# Patient Record
Sex: Female | Born: 1945 | Race: White | Hispanic: No | Smoking: Never smoker
Health system: Southern US, Community
[De-identification: ages and names within clinical notes are randomized; demographics above are authoritative.]

## PROBLEM LIST (undated history)

## (undated) DIAGNOSIS — I350 Nonrheumatic aortic (valve) stenosis: Secondary | ICD-10-CM

## (undated) DIAGNOSIS — E079 Disorder of thyroid, unspecified: Secondary | ICD-10-CM

## (undated) DIAGNOSIS — E7800 Pure hypercholesterolemia, unspecified: Secondary | ICD-10-CM

## (undated) DIAGNOSIS — Z972 Presence of dental prosthetic device (complete) (partial): Secondary | ICD-10-CM

## (undated) DIAGNOSIS — R011 Cardiac murmur, unspecified: Secondary | ICD-10-CM

## (undated) DIAGNOSIS — F419 Anxiety disorder, unspecified: Secondary | ICD-10-CM

## (undated) DIAGNOSIS — J302 Other seasonal allergic rhinitis: Secondary | ICD-10-CM

## (undated) DIAGNOSIS — M1990 Unspecified osteoarthritis, unspecified site: Secondary | ICD-10-CM

## (undated) DIAGNOSIS — Z973 Presence of spectacles and contact lenses: Secondary | ICD-10-CM

## (undated) HISTORY — DX: Cardiac murmur, unspecified: R01.1

## (undated) HISTORY — PX: ABDOMINAL HYSTERECTOMY: SHX81

## (undated) HISTORY — PX: COLONOSCOPY: SHX174

## (undated) HISTORY — DX: Nonrheumatic aortic (valve) stenosis: I35.0

---

## 1999-03-10 ENCOUNTER — Other Ambulatory Visit: Admission: RE | Admit: 1999-03-10 | Discharge: 1999-03-10 | Payer: Self-pay | Admitting: Obstetrics & Gynecology

## 2000-02-29 ENCOUNTER — Other Ambulatory Visit: Admission: RE | Admit: 2000-02-29 | Discharge: 2000-02-29 | Payer: Self-pay | Admitting: Obstetrics & Gynecology

## 2001-02-23 ENCOUNTER — Other Ambulatory Visit: Admission: RE | Admit: 2001-02-23 | Discharge: 2001-02-23 | Payer: Self-pay | Admitting: Obstetrics & Gynecology

## 2001-05-02 HISTORY — PX: SHOULDER ARTHROSCOPY: SHX128

## 2001-05-09 ENCOUNTER — Observation Stay (HOSPITAL_COMMUNITY): Admission: RE | Admit: 2001-05-09 | Discharge: 2001-05-10 | Payer: Self-pay | Admitting: Orthopedic Surgery

## 2002-02-11 ENCOUNTER — Other Ambulatory Visit: Admission: RE | Admit: 2002-02-11 | Discharge: 2002-02-11 | Payer: Self-pay | Admitting: Obstetrics & Gynecology

## 2003-02-17 ENCOUNTER — Other Ambulatory Visit: Admission: RE | Admit: 2003-02-17 | Discharge: 2003-02-17 | Payer: Self-pay | Admitting: Obstetrics & Gynecology

## 2004-02-19 ENCOUNTER — Other Ambulatory Visit: Admission: RE | Admit: 2004-02-19 | Discharge: 2004-02-19 | Payer: Self-pay | Admitting: Obstetrics & Gynecology

## 2005-02-23 ENCOUNTER — Other Ambulatory Visit: Admission: RE | Admit: 2005-02-23 | Discharge: 2005-02-23 | Payer: Self-pay | Admitting: Obstetrics & Gynecology

## 2007-04-11 ENCOUNTER — Encounter: Admission: RE | Admit: 2007-04-11 | Discharge: 2007-04-11 | Payer: Self-pay | Admitting: Obstetrics & Gynecology

## 2008-05-02 HISTORY — PX: SHOULDER ARTHROSCOPY: SHX128

## 2008-12-27 ENCOUNTER — Emergency Department (HOSPITAL_BASED_OUTPATIENT_CLINIC_OR_DEPARTMENT_OTHER): Admission: EM | Admit: 2008-12-27 | Discharge: 2008-12-27 | Payer: Self-pay | Admitting: Emergency Medicine

## 2008-12-27 ENCOUNTER — Ambulatory Visit: Payer: Self-pay | Admitting: Diagnostic Radiology

## 2009-05-11 ENCOUNTER — Encounter: Admission: RE | Admit: 2009-05-11 | Discharge: 2009-08-09 | Payer: Self-pay | Admitting: Orthopedic Surgery

## 2009-08-10 ENCOUNTER — Encounter: Admission: RE | Admit: 2009-08-10 | Discharge: 2009-11-08 | Payer: Self-pay | Admitting: Orthopedic Surgery

## 2009-11-09 ENCOUNTER — Encounter: Admission: RE | Admit: 2009-11-09 | Discharge: 2010-01-28 | Payer: Self-pay | Admitting: Orthopedic Surgery

## 2010-09-17 NOTE — Op Note (Signed)
[facility]  Patient:    Audrey Hatfield, Audrey Hatfield Visit Number: [id number] MRN: [id number]          Service Type: SUR Location: 4W [id number] [id number] Attending Physician:  Marlowe Kays Page Dictated by:   Illene Labrador. Aplington, M.D. Admit Date:  05/09/2001                             Operative Report  NO DICTATION. Dictated by:   Illene Labrador. Aplington, M.D. Attending Physician:  Joaquin Courts DD:  05/09/01 TD:  05/09/01 Job: [id number]

## 2011-01-19 IMAGING — CR DG SHOULDER 2+V*R*
3 series · 3 of 3 positions shown · non-contrast
Comparison: None available.

CLINICAL DATA: Shoulder pain.

RIGHT SHOULDER - 2+ VIEW

[w shoulder ap internal righ]
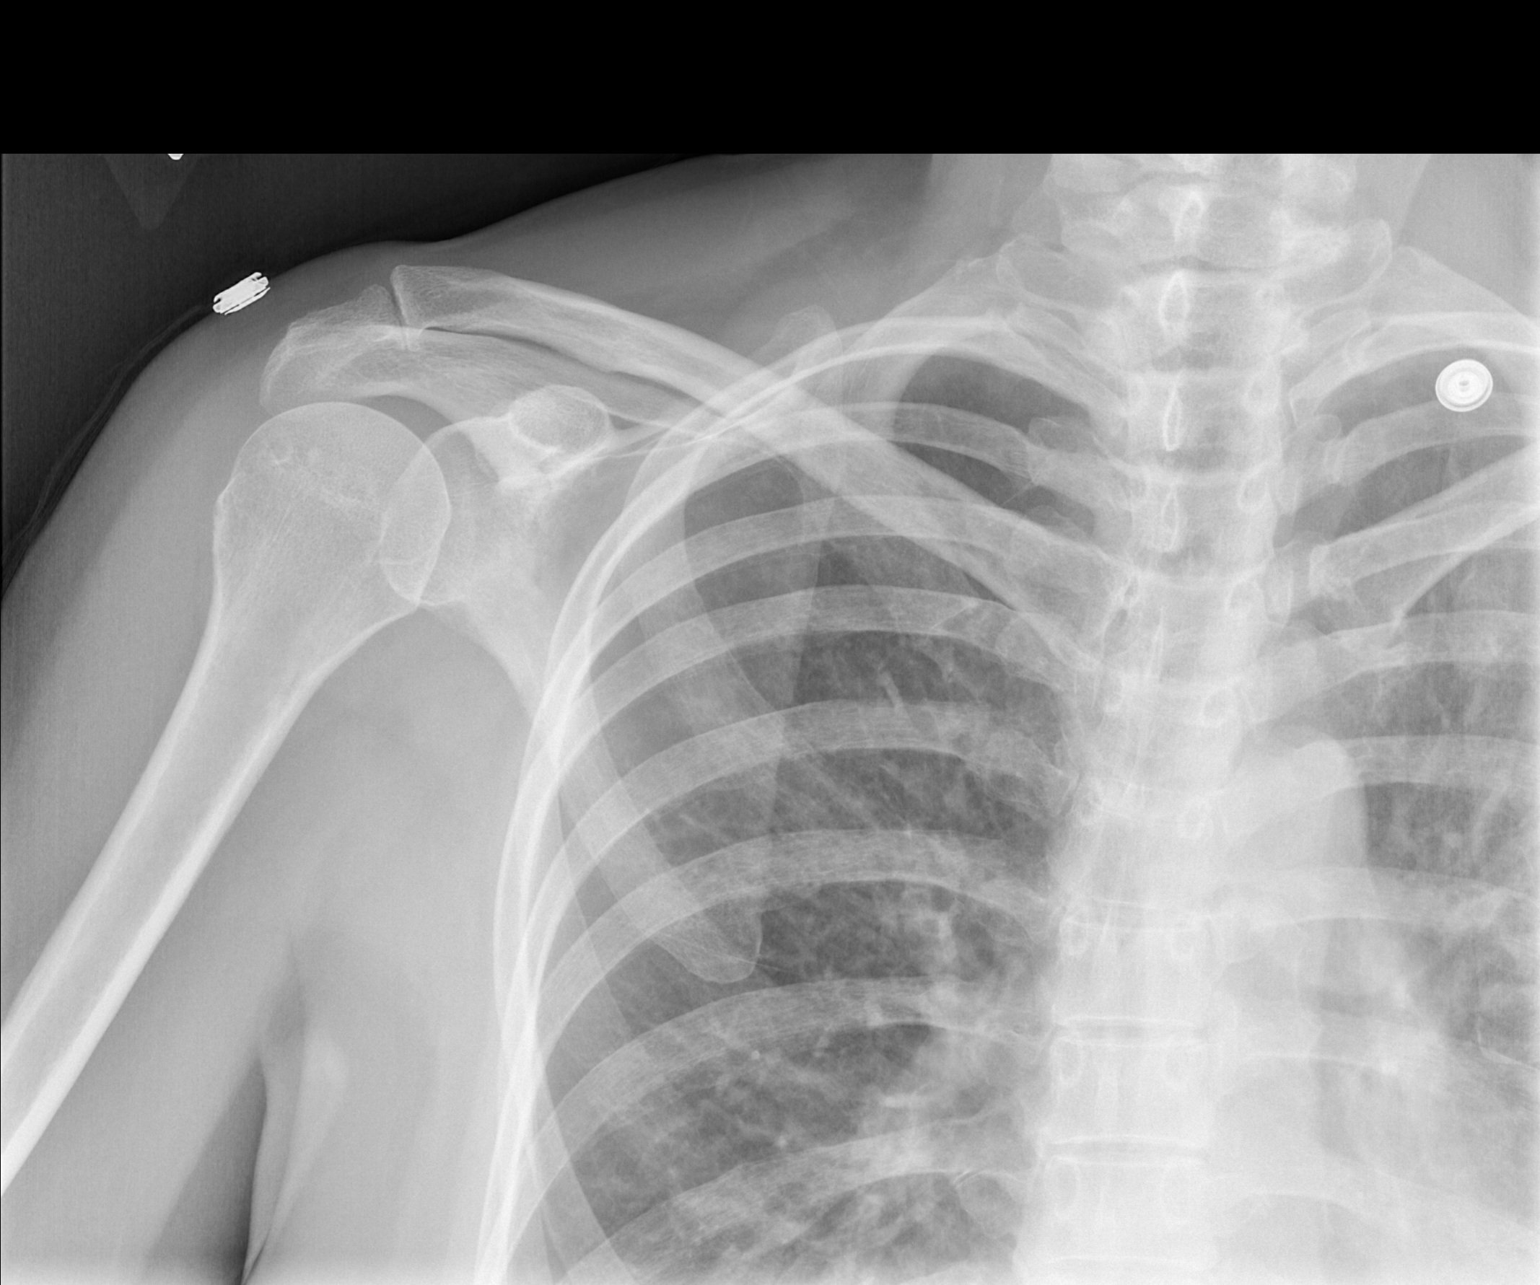

[w shoulder ap external righ]
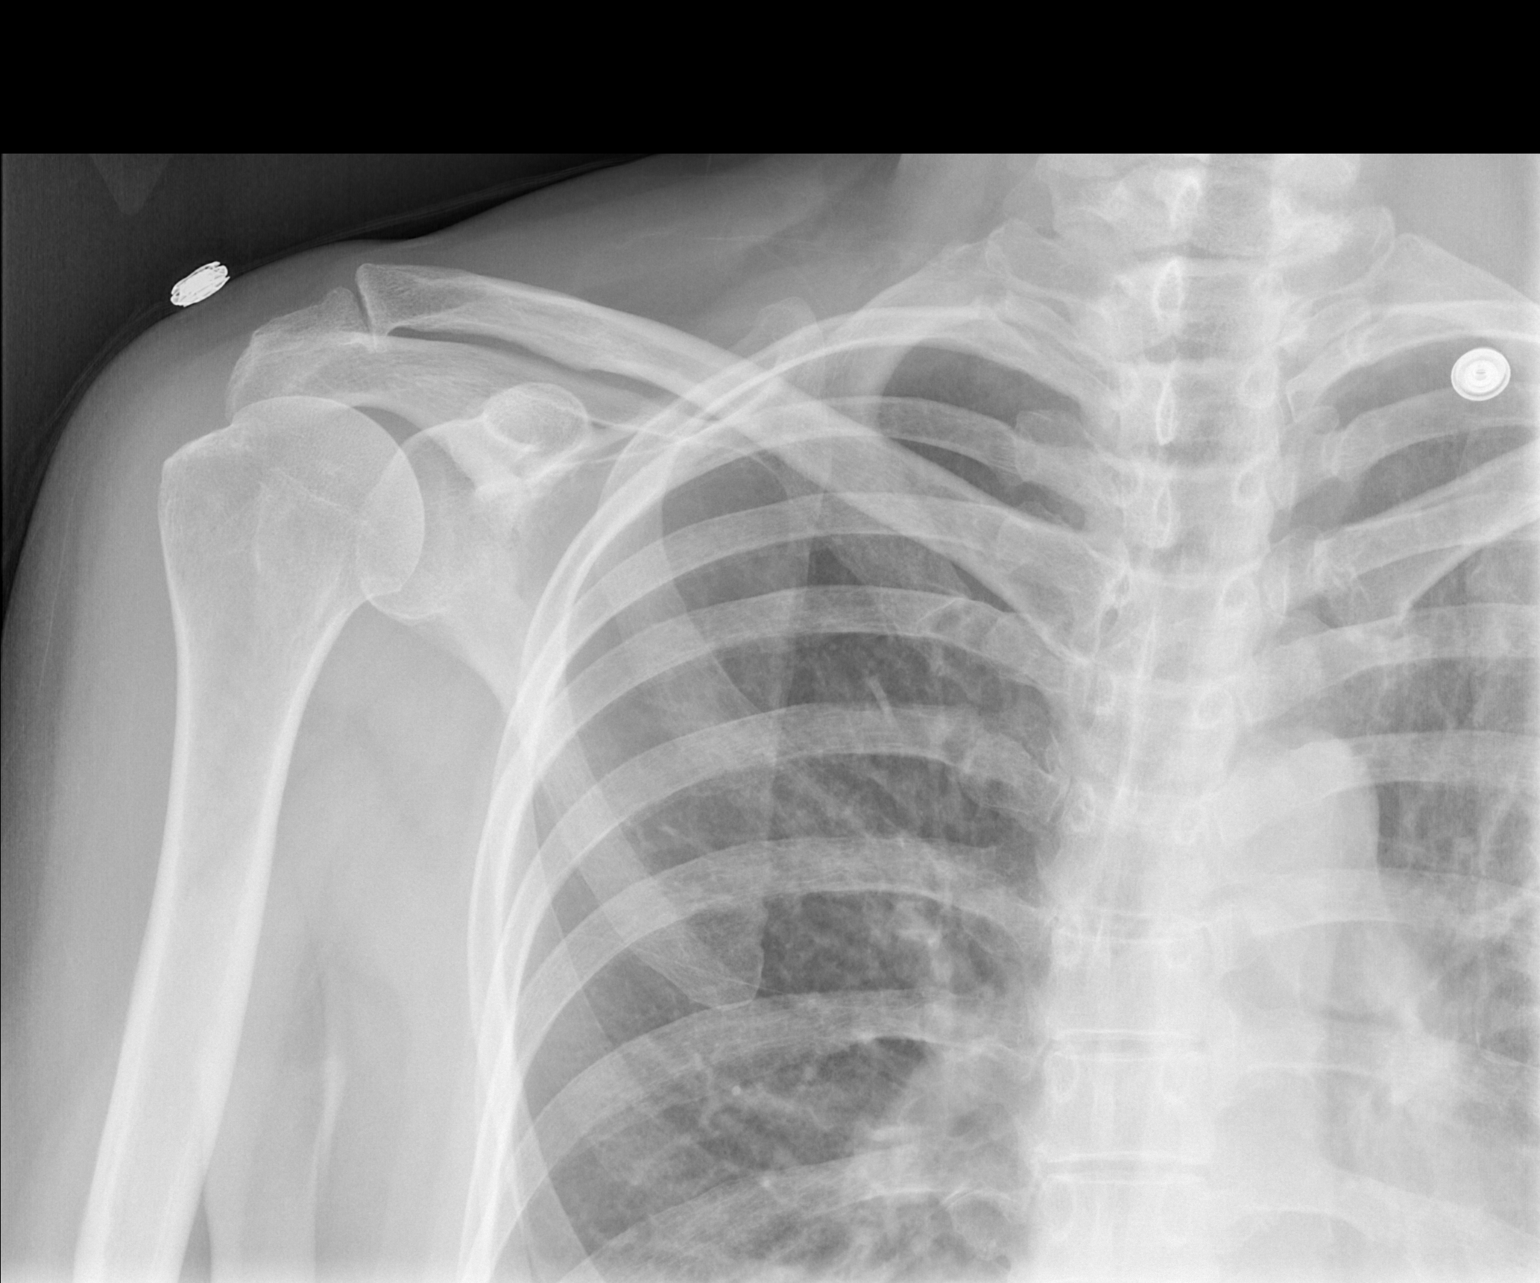

[w shoulder y view right]
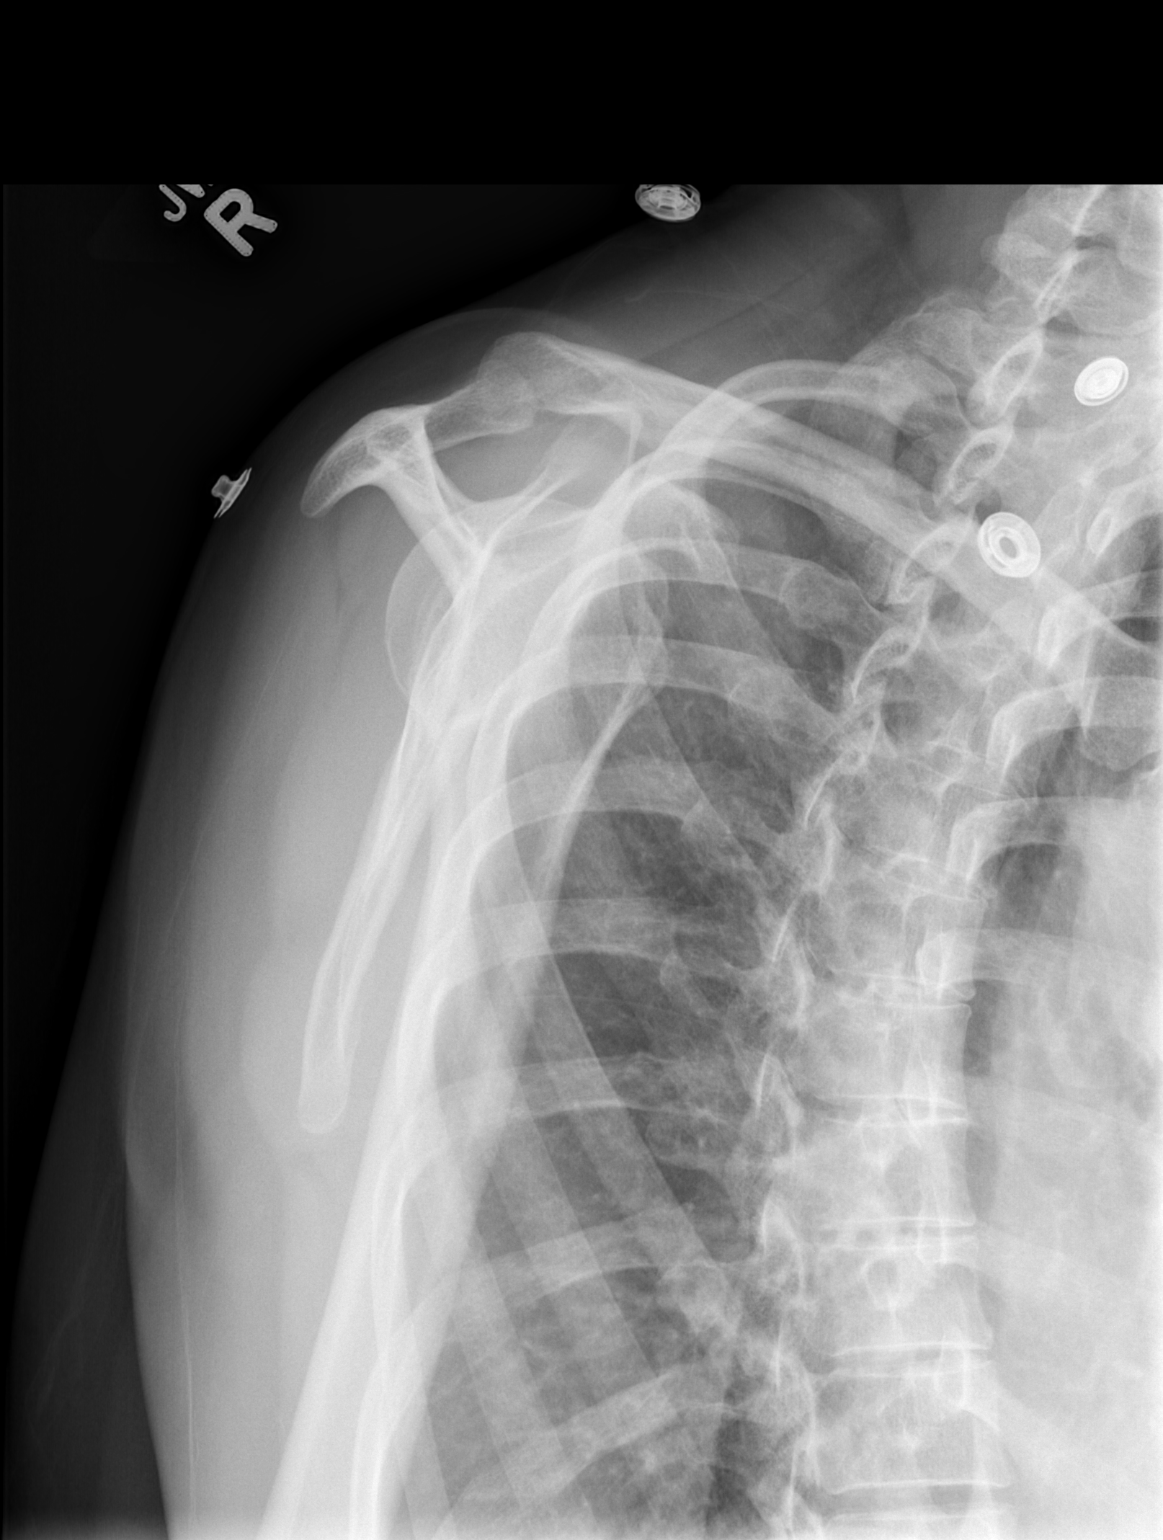

[3 of 3 positions shown; findings below may reference images not displayed]

FINDINGS: The humerus is located and the acromioclavicular joint is
intact.  There is no fracture.  Acromioclavicular degenerative
disease is noted.  Imaged lung parenchyma and ribs are
unremarkable.
IMPRESSION: 1.  No acute finding.
2.  Acromioclavicular osteoarthritis.

## 2014-04-13 ENCOUNTER — Emergency Department (HOSPITAL_BASED_OUTPATIENT_CLINIC_OR_DEPARTMENT_OTHER)
Admission: EM | Admit: 2014-04-13 | Discharge: 2014-04-13 | Disposition: A | Discharge status: Home / Self Care | Payer: Medicare Other | Attending: Emergency Medicine | Admitting: Emergency Medicine

## 2014-04-13 ENCOUNTER — Encounter (HOSPITAL_BASED_OUTPATIENT_CLINIC_OR_DEPARTMENT_OTHER): Payer: Self-pay

## 2014-04-13 ENCOUNTER — Emergency Department (HOSPITAL_BASED_OUTPATIENT_CLINIC_OR_DEPARTMENT_OTHER): Payer: Medicare Other

## 2014-04-13 DIAGNOSIS — S86112A Strain of other muscle(s) and tendon(s) of posterior muscle group at lower leg level, left leg, initial encounter: Secondary | ICD-10-CM | POA: Diagnosis not present

## 2014-04-13 DIAGNOSIS — Y998 Other external cause status: Secondary | ICD-10-CM | POA: Diagnosis not present

## 2014-04-13 DIAGNOSIS — Z79899 Other long term (current) drug therapy: Secondary | ICD-10-CM | POA: Insufficient documentation

## 2014-04-13 DIAGNOSIS — Y9289 Other specified places as the place of occurrence of the external cause: Secondary | ICD-10-CM | POA: Insufficient documentation

## 2014-04-13 DIAGNOSIS — S8992XA Unspecified injury of left lower leg, initial encounter: Secondary | ICD-10-CM | POA: Diagnosis present

## 2014-04-13 DIAGNOSIS — E780 Pure hypercholesterolemia: Secondary | ICD-10-CM | POA: Diagnosis not present

## 2014-04-13 DIAGNOSIS — Y9389 Activity, other specified: Secondary | ICD-10-CM | POA: Insufficient documentation

## 2014-04-13 DIAGNOSIS — T1490XA Injury, unspecified, initial encounter: Secondary | ICD-10-CM

## 2014-04-13 DIAGNOSIS — E079 Disorder of thyroid, unspecified: Secondary | ICD-10-CM | POA: Diagnosis not present

## 2014-04-13 DIAGNOSIS — X58XXXA Exposure to other specified factors, initial encounter: Secondary | ICD-10-CM | POA: Insufficient documentation

## 2014-04-13 DIAGNOSIS — S96812A Strain of other specified muscles and tendons at ankle and foot level, left foot, initial encounter: Secondary | ICD-10-CM

## 2014-04-13 HISTORY — DX: Disorder of thyroid, unspecified: E07.9

## 2014-04-13 HISTORY — DX: Pure hypercholesterolemia, unspecified: E78.00

## 2014-04-13 MED ORDER — HYDROCODONE-ACETAMINOPHEN 5-325 MG PO TABS: 2.0000 | ORAL_TABLET | ORAL | Status: DC | PRN

## 2014-04-13 NOTE — Discharge Instructions (Signed)
Your exam and history suggest that you have ruptured your plantaris tendon. Is a tiny muscle and tendon in the back of her leg that will not require repair. Symptoms with pain and swelling may last from one to 2 weeks. Avoid walking and use of your leg and elevate it whenever possible. Follow-up with your physician if symptoms aren't starting to improve within a week.  Tendon Injury Tendons are strong, cordlike structures that connect muscle to bone. Tendons are made up of woven fibers, like a rope. A tendon injury is a tear (rupture) of the tendon. The rupture may be partial (only a few of the fibers in your tendon rupture) or complete (your entire tendon ruptures). CAUSES  Tendon injuries can be caused by high-stress activities, such as sports. They also can be caused by a repetitive injury or by a single injury from an excessive, rapid force. SYMPTOMS  Symptoms of tendon injury include pain when you move the joint close to the tendon. Other symptoms are swelling, redness, and warmth. DIAGNOSIS  Tendon injuries often can be diagnosed by physical exam. However, sometimes an X-ray exam or advanced imaging, such as magnetic resonance imaging (MRI), is necessary to determine the extent of the injury. TREATMENT  Partial tendon ruptures often can be treated with immobilization. A splint, bandage, or removable brace usually is used to immobilize the injured tendon. Most injured tendons need to be immobilized for 1-2 months before they are completely healed. Complete tendon ruptures may require surgical reattachment. Document Released: 05/26/2004 Document Revised: 04/07/2011 Document Reviewed: 07/10/2011 [facility] Patient Information 2015 [place], [place]. This information is not intended to replace advice given to you by your health care provider. Make sure you discuss any questions you have with your health care provider.

## 2014-04-13 NOTE — ED Provider Notes (Signed)
CSN: [id number]     Arrival date & time 04/13/14  [id number] History   First MD Initiated Contact with Patient 04/13/14 [phone]     Chief Complaint  Patient presents with  . Knee Injury     HPI  She presents for evaluation of left calf pain. She states today's ago she was standing from a chair. When she went to take her first she felt a pop and pain in the back of her knee on the left side. It is been sore and painful with walking since then. Worse  this morning.  Past Medical History  Diagnosis Date  . High cholesterol   . Thyroid disease    History reviewed. No pertinent past surgical history. No family history on file. History  Substance Use Topics  . Smoking status: Never Smoker   . Smokeless tobacco: Not on file  . Alcohol Use: Not on file   OB History    No data available     Review of Systems  Constitutional: Negative for fever, chills, diaphoresis, appetite change and fatigue.  HENT: Negative for mouth sores, sore throat and trouble swallowing.   Eyes: Negative for visual disturbance.  Respiratory: Negative for cough, chest tightness, shortness of breath and wheezing.   Cardiovascular: Negative for chest pain.  Gastrointestinal: Negative for nausea, vomiting, abdominal pain, diarrhea and abdominal distention.  Endocrine: Negative for polydipsia, polyphagia and polyuria.  Genitourinary: Negative for dysuria, frequency and hematuria.  Musculoskeletal: Negative for gait problem.       Left calf pain  Skin: Negative for color change, pallor and rash.  Neurological: Negative for dizziness, syncope, light-headedness and headaches.  Hematological: Does not bruise/bleed easily.  Psychiatric/Behavioral: Negative for behavioral problems and confusion.      Allergies  Review of patient's allergies indicates no known allergies.  Home Medications   Prior to Admission medications   Medication Sig Start Date End Date Taking? Authorizing Provider  ALPRAZolam Prudy Feeler(XANAX) 0.5 MG  tablet Take 0.5 mg by mouth at bedtime as needed for anxiety.   Yes Historical Provider, MD  atorvastatin (LIPITOR) 20 MG tablet Take 20 mg by mouth daily.   Yes Historical Provider, MD  levothyroxine (SYNTHROID, LEVOTHROID) 25 MCG tablet Take 25 mcg by mouth daily before breakfast.   Yes Historical Provider, MD  HYDROcodone-acetaminophen (NORCO/VICODIN) 5-325 MG per tablet Take 2 tablets by mouth every 4 (four) hours as needed. 04/13/14   Rolland PorterMark Zecca, MD   BP 164/88 mmHg  Pulse 70  Temp(Src) 97.1 F (36.2 C)  Resp 20  Ht 5\' 3"  (1.6 m)  Wt 126 lb (57.153 kg)  BMI 22.33 kg/m2  SpO2 100% Physical Exam  Constitutional: She is oriented to person, place, and time. She appears well-developed and well-nourished. No distress.  HENT:  Head: Normocephalic.  Eyes: Conjunctivae are normal. Pupils are equal, round, and reactive to light. No scleral icterus.  Neck: Normal range of motion. Neck supple. No thyromegaly present.  Cardiovascular: Normal rate and regular rhythm.  Exam reveals no gallop and no friction rub.   No murmur heard. Pulmonary/Chest: Effort normal and breath sounds normal. No respiratory distress. She has no wheezes. She has no rales.  Abdominal: Soft. Bowel sounds are normal. She exhibits no distension. There is no tenderness. There is no rebound.  Musculoskeletal: Normal range of motion.       Legs: Neurological: She is alert and oriented to person, place, and time.  Skin: Skin is warm and dry. No rash noted.  Psychiatric: She  has a normal mood and affect. Her behavior is normal.    ED Course  Procedures (including critical care time) Labs Review Labs Reviewed - No data to display  Imaging Review No results found.   EKG Interpretation None      MDM   Final diagnoses:  Rupture of plantaris tendon, left, initial encounter    History and exam are all consistent with probable plantaris tendon rupture. Her gastrocnemius mechanism and Achilles tendon are intact. No  swelling or asymmetry to suggest DVT sudden in onset. Plan will be avoiding activity, elevation, pain control. Primary care follow-up if not improving within the week.    Rolland PorterMark Rio, MD 04/13/14 [phone]

## 2014-04-13 NOTE — ED Notes (Signed)
Patient here with left knee injury on Friday night. Reports that she stood from chair and felt something pop in back of knee, arrived with brace to same. Reports unable to ambulate or rest due to discomfort

## 2014-08-05 ENCOUNTER — Encounter (HOSPITAL_BASED_OUTPATIENT_CLINIC_OR_DEPARTMENT_OTHER): Payer: Self-pay | Admitting: *Deleted

## 2014-08-05 NOTE — Progress Notes (Signed)
No labs needed

## 2014-08-08 ENCOUNTER — Ambulatory Visit (HOSPITAL_BASED_OUTPATIENT_CLINIC_OR_DEPARTMENT_OTHER): Payer: Medicare Other | Admitting: Certified Registered"

## 2014-08-08 ENCOUNTER — Encounter (HOSPITAL_BASED_OUTPATIENT_CLINIC_OR_DEPARTMENT_OTHER): Payer: Self-pay | Admitting: *Deleted

## 2014-08-08 ENCOUNTER — Ambulatory Visit (HOSPITAL_BASED_OUTPATIENT_CLINIC_OR_DEPARTMENT_OTHER)
Admission: RE | Admit: 2014-08-08 | Discharge: 2014-08-08 | Disposition: A | Discharge status: Home / Self Care | Payer: Medicare Other | Source: Ambulatory Visit | Attending: Orthopedic Surgery | Admitting: Orthopedic Surgery

## 2014-08-08 ENCOUNTER — Encounter (HOSPITAL_BASED_OUTPATIENT_CLINIC_OR_DEPARTMENT_OTHER)
Admission: RE | Disposition: A | Discharge status: Home / Self Care | Payer: Self-pay | Source: Ambulatory Visit | Attending: Orthopedic Surgery

## 2014-08-08 DIAGNOSIS — E780 Pure hypercholesterolemia: Secondary | ICD-10-CM | POA: Diagnosis not present

## 2014-08-08 DIAGNOSIS — M1990 Unspecified osteoarthritis, unspecified site: Secondary | ICD-10-CM | POA: Diagnosis not present

## 2014-08-08 DIAGNOSIS — E079 Disorder of thyroid, unspecified: Secondary | ICD-10-CM | POA: Diagnosis not present

## 2014-08-08 DIAGNOSIS — J302 Other seasonal allergic rhinitis: Secondary | ICD-10-CM | POA: Insufficient documentation

## 2014-08-08 DIAGNOSIS — G5602 Carpal tunnel syndrome, left upper limb: Secondary | ICD-10-CM | POA: Insufficient documentation

## 2014-08-08 DIAGNOSIS — F419 Anxiety disorder, unspecified: Secondary | ICD-10-CM | POA: Insufficient documentation

## 2014-08-08 DIAGNOSIS — Z79899 Other long term (current) drug therapy: Secondary | ICD-10-CM | POA: Insufficient documentation

## 2014-08-08 HISTORY — DX: Unspecified osteoarthritis, unspecified site: M19.90

## 2014-08-08 HISTORY — DX: Presence of dental prosthetic device (complete) (partial): Z97.2

## 2014-08-08 HISTORY — PX: CARPAL TUNNEL RELEASE: SHX101

## 2014-08-08 HISTORY — DX: Anxiety disorder, unspecified: F41.9

## 2014-08-08 HISTORY — DX: Other seasonal allergic rhinitis: J30.2

## 2014-08-08 HISTORY — DX: Presence of spectacles and contact lenses: Z97.3

## 2014-08-08 LAB — POCT HEMOGLOBIN-HEMACUE: Hemoglobin: 14.7 g/dL (ref 12.0–15.0)

## 2014-08-08 SURGERY — CARPAL TUNNEL RELEASE
Anesthesia: Monitor Anesthesia Care | Site: Wrist | Laterality: Left

## 2014-08-08 MED ORDER — OXYCODONE HCL 5 MG/5ML PO SOLN: 5.0000 mg | Freq: Once | ORAL | Status: DC | PRN

## 2014-08-08 MED ORDER — MIDAZOLAM HCL 2 MG/2ML IJ SOLN: 1.0000 mg | INTRAMUSCULAR | Status: DC | PRN

## 2014-08-08 MED ORDER — OXYCODONE HCL 5 MG PO TABS: 5.0000 mg | ORAL_TABLET | Freq: Once | ORAL | Status: DC | PRN

## 2014-08-08 MED ORDER — PROMETHAZINE HCL 25 MG/ML IJ SOLN: 6.2500 mg | INTRAMUSCULAR | Status: DC | PRN

## 2014-08-08 MED ORDER — ONDANSETRON HCL 4 MG/2ML IJ SOLN
INTRAMUSCULAR | Status: DC | PRN
  Administered 2014-08-08: 4 mg via INTRAVENOUS

## 2014-08-08 MED ORDER — LACTATED RINGERS IV SOLN
INTRAVENOUS | Status: DC
  Administered 2014-08-08: 09:00:00 via INTRAVENOUS

## 2014-08-08 MED ORDER — SODIUM BICARBONATE 4 % IV SOLN
INTRAVENOUS | Status: DC | PRN
  Administered 2014-08-08: 2 mL via SUBCUTANEOUS

## 2014-08-08 MED ORDER — CEFAZOLIN SODIUM-DEXTROSE 2-3 GM-% IV SOLR
INTRAVENOUS | Status: DC | PRN
  Administered 2014-08-08: 2 g via INTRAVENOUS

## 2014-08-08 MED ORDER — LIDOCAINE HCL (PF) 1 % IJ SOLN
INTRAMUSCULAR | Status: DC | PRN
  Administered 2014-08-08: 10 mL

## 2014-08-08 MED ORDER — CEFAZOLIN SODIUM-DEXTROSE 2-3 GM-% IV SOLR
INTRAVENOUS | Status: AC
  Filled 2014-08-08: qty 50

## 2014-08-08 MED ORDER — FENTANYL CITRATE 0.05 MG/ML IJ SOLN
INTRAMUSCULAR | Status: DC | PRN
  Administered 2014-08-08: 50 ug via INTRAVENOUS

## 2014-08-08 MED ORDER — HYDROMORPHONE HCL 1 MG/ML IJ SOLN: 0.2500 mg | INTRAMUSCULAR | Status: DC | PRN

## 2014-08-08 MED ORDER — HYDROCODONE-ACETAMINOPHEN 5-325 MG PO TABS: 2.0000 | ORAL_TABLET | Freq: Four times a day (QID) | ORAL | Status: DC | PRN

## 2014-08-08 MED ORDER — MIDAZOLAM HCL 5 MG/5ML IJ SOLN
INTRAMUSCULAR | Status: DC | PRN
  Administered 2014-08-08: 1 mg via INTRAVENOUS

## 2014-08-08 MED ORDER — FENTANYL CITRATE 0.05 MG/ML IJ SOLN: 50.0000 ug | INTRAMUSCULAR | Status: DC | PRN

## 2014-08-08 MED ORDER — BUPIVACAINE HCL (PF) 0.25 % IJ SOLN
INTRAMUSCULAR | Status: DC | PRN
  Administered 2014-08-08: 10 mL

## 2014-08-08 SURGICAL SUPPLY — 50 items
BANDAGE ELASTIC 3 VELCRO ST LF (GAUZE/BANDAGES/DRESSINGS) ×3 IMPLANT
BLADE CARPAL TUNNEL SNGL USE (BLADE) ×3 IMPLANT
BLADE SURG 15 STRL LF DISP TIS (BLADE) ×2 IMPLANT
BLADE SURG 15 STRL SS (BLADE) ×6
BNDG CONFORM 3 STRL LF (GAUZE/BANDAGES/DRESSINGS) ×3 IMPLANT
BRUSH SCRUB EZ PLAIN DRY (MISCELLANEOUS) ×3 IMPLANT
CORDS BIPOLAR (ELECTRODE) ×3 IMPLANT
COVER BACK TABLE 60X90IN (DRAPES) ×3 IMPLANT
COVER MAYO STAND STRL (DRAPES) ×3 IMPLANT
CUFF TOURNIQUET SINGLE 18IN (TOURNIQUET CUFF) IMPLANT
DRAIN PENROSE 1/4X12 LTX STRL (WOUND CARE) IMPLANT
DRAPE EXTREMITY T 121X128X90 (DRAPE) ×3 IMPLANT
DRAPE SURG 17X23 STRL (DRAPES) ×3 IMPLANT
DRSG EMULSION OIL 3X3 NADH (GAUZE/BANDAGES/DRESSINGS) ×3 IMPLANT
GAUZE SPONGE 4X4 12PLY STRL (GAUZE/BANDAGES/DRESSINGS) IMPLANT
GAUZE SPONGE 4X4 16PLY XRAY LF (GAUZE/BANDAGES/DRESSINGS) IMPLANT
GAUZE XEROFORM 1X8 LF (GAUZE/BANDAGES/DRESSINGS) ×3 IMPLANT
GLOVE BIO SURGEON STRL SZ 6.5 (GLOVE) ×1 IMPLANT
GLOVE BIO SURGEONS STRL SZ 6.5 (GLOVE) ×1
GLOVE BIOGEL M STRL SZ7.5 (GLOVE) ×3 IMPLANT
GLOVE BIOGEL PI IND STRL 7.0 (GLOVE) IMPLANT
GLOVE BIOGEL PI INDICATOR 7.0 (GLOVE) ×6
GLOVE ECLIPSE 7.0 STRL STRAW (GLOVE) ×2 IMPLANT
GLOVE SS BIOGEL STRL SZ 8 (GLOVE) ×1 IMPLANT
GLOVE SUPERSENSE BIOGEL SZ 8 (GLOVE) ×2
GOWN STRL REUS W/ TWL LRG LVL3 (GOWN DISPOSABLE) ×1 IMPLANT
GOWN STRL REUS W/ TWL XL LVL3 (GOWN DISPOSABLE) ×1 IMPLANT
GOWN STRL REUS W/TWL LRG LVL3 (GOWN DISPOSABLE) ×3
GOWN STRL REUS W/TWL XL LVL3 (GOWN DISPOSABLE) ×3
LOOP VESSEL MAXI BLUE (MISCELLANEOUS) IMPLANT
NDL HYPO 25X1 1.5 SAFETY (NEEDLE) ×2 IMPLANT
NDL SAFETY ECLIPSE 18X1.5 (NEEDLE) ×1 IMPLANT
NEEDLE HYPO 18GX1.5 SHARP (NEEDLE) ×3
NEEDLE HYPO 22GX1.5 SAFETY (NEEDLE) IMPLANT
NEEDLE HYPO 25X1 1.5 SAFETY (NEEDLE) ×6 IMPLANT
NS IRRIG 1000ML POUR BTL (IV SOLUTION) ×3 IMPLANT
PACK BASIN DAY SURGERY FS (CUSTOM PROCEDURE TRAY) ×3 IMPLANT
PAD ALCOHOL SWAB (MISCELLANEOUS) ×24 IMPLANT
PAD CAST 3X4 CTTN HI CHSV (CAST SUPPLIES) ×2 IMPLANT
PADDING CAST ABS 4INX4YD NS (CAST SUPPLIES) ×2
PADDING CAST ABS COTTON 4X4 ST (CAST SUPPLIES) ×1 IMPLANT
PADDING CAST COTTON 3X4 STRL (CAST SUPPLIES) ×6
STOCKINETTE 4X48 STRL (DRAPES) ×3 IMPLANT
SUT PROLENE 4 0 PS 2 18 (SUTURE) ×3 IMPLANT
SYR BULB 3OZ (MISCELLANEOUS) ×3 IMPLANT
SYR CONTROL 10ML LL (SYRINGE) ×6 IMPLANT
TOWEL OR 17X24 6PK STRL BLUE (TOWEL DISPOSABLE) ×3 IMPLANT
TOWEL OR NON WOVEN STRL DISP B (DISPOSABLE) ×3 IMPLANT
TRAY DSU PREP LF (CUSTOM PROCEDURE TRAY) ×3 IMPLANT
UNDERPAD 30X30 INCONTINENT (UNDERPADS AND DIAPERS) ×3 IMPLANT

## 2014-08-08 NOTE — Transfer of Care (Signed)
Immediate Anesthesia Transfer of Care Note  Patient: Audrey Hatfield  Procedure(s) Performed: Procedure(s): LEFT CARPAL TUNNEL RELEASE (Left)  Patient Location: PACU  Anesthesia Type:MAC  Level of Consciousness: awake, alert , oriented and patient cooperative  Airway & Oxygen Therapy: Patient Spontanous Breathing  Post-op Assessment: Report given to RN, Post -op Vital signs reviewed and stable and Patient moving all extremities  Post vital signs: Reviewed and stable  Last Vitals:  Filed Vitals:   08/08/14 0817  BP: 157/73  Pulse: 58  Temp: 36.5 C  Resp: 16    Complications: No apparent anesthesia complications

## 2014-08-08 NOTE — Discharge Instructions (Signed)
We recommend that you to take vitamin C 1000 mg a day to promote healing. °We also recommend that if you require  pain medicine that you take a stool softener to prevent constipation as most pain medicines will have constipation side effects. We recommend either Peri-Colace or Senokot and recommend that you also consider adding MiraLAX to prevent the constipation affects from pain medicine if you are required to use them. These medicines are over the counter and maybe purchased at a local pharmacy. A cup of yogurt and a probiotic can also be helpful during the recovery process as the medicines can disrupt your intestinal environment. °Keep bandage clean and dry.  Call for any problems.  No smoking.  Criteria for driving a car: you should be off your pain medicine for 7-8 hours, able to drive one handed(confident), thinking clearly and feeling able in your judgement to drive. °Continue elevation as it will decrease swelling.  If instructed by MD move your fingers within the confines of the bandage/splint.  Use ice if instructed by your MD. Call immediately for any sudden loss of feeling in your hand/arm or change in functional abilities of the extremity. ° ° °Post Anesthesia Home Care Instructions ° °Activity: °Get plenty of rest for the remainder of the day. A responsible adult should stay with you for 24 hours following the procedure.  °For the next 24 hours, DO NOT: °-Drive a car °-Operate machinery °-Drink alcoholic beverages °-Take any medication unless instructed by your physician °-Make any legal decisions or sign important papers. ° °Meals: °Start with liquid foods such as gelatin or soup. Progress to regular foods as tolerated. Avoid greasy, spicy, heavy foods. If nausea and/or vomiting occur, drink only clear liquids until the nausea and/or vomiting subsides. Call your physician if vomiting continues. ° °Special Instructions/Symptoms: °Your throat may feel dry or sore from the anesthesia or the breathing  tube placed in your throat during surgery. If this causes discomfort, gargle with warm salt water. The discomfort should disappear within 24 hours. ° °If you had a scopolamine patch placed behind your ear for the management of post- operative nausea and/or vomiting: ° °1. The medication in the patch is effective for 72 hours, after which it should be removed.  Wrap patch in a tissue and discard in the trash. Wash hands thoroughly with soap and water. °2. You may remove the patch earlier than 72 hours if you experience unpleasant side effects which may include dry mouth, dizziness or visual disturbances. °3. Avoid touching the patch. Wash your hands with soap and water after contact with the patch. °  ° °

## 2014-08-08 NOTE — Op Note (Signed)
See ZOXWRUEAV#[id number]#[id number] Amanda PeaGramig MD

## 2014-08-08 NOTE — Anesthesia Postprocedure Evaluation (Signed)
Anesthesia Post Note  Patient: Audrey Hatfield  Procedure(s) Performed: Procedure(s) (LRB): LEFT CARPAL TUNNEL RELEASE (Left)  Anesthesia type: MAC  Patient location: PACU  Post pain: Pain level controlled  Post assessment: Patient's Cardiovascular Status Stable  Last Vitals:  Filed Vitals:   08/08/14 1111  BP:   Pulse: 60  Temp:   Resp: 22    Post vital signs: Reviewed and stable  Level of consciousness: sedated  Complications: No apparent anesthesia complications

## 2014-08-08 NOTE — Anesthesia Procedure Notes (Signed)
Procedure Name: MAC Date/Time: 08/08/2014 10:10 AM Performed by: Curly ShoresRAFT, Betta Balla W Pre-anesthesia Checklist: Patient identified, Emergency Drugs available, Suction available and Patient being monitored Patient Re-evaluated:Patient Re-evaluated prior to inductionOxygen Delivery Method: Simple face mask Preoxygenation: Pre-oxygenation with 100% oxygen Intubation Type: IV induction Dental Injury: Teeth and Oropharynx as per pre-operative assessment

## 2014-08-08 NOTE — H&P (Signed)
Audrey Hatfield is an 69 y.o. female.   Chief Complaint: Left carpal tunnel syndrome HPI: Patient presents for left carpal tunnel release  Past Medical History  Diagnosis Date  . High cholesterol   . Thyroid disease   . Seasonal allergies   . Wears glasses   . Wears partial dentures   . Arthritis   . Anxiety     Past Surgical History  Procedure Laterality Date  . Abdominal hysterectomy    . Shoulder arthroscopy  2010    right  . Shoulder arthroscopy  2003    left  . Colonoscopy      History reviewed. No pertinent family history. Social History:  reports that she has never smoked. She does not have any smokeless tobacco history on file. She reports that she does not drink alcohol or use illicit drugs.  Allergies: No Known Allergies  Medications Prior to Admission  Medication Sig Dispense Refill  . acetaminophen (TYLENOL) 325 MG tablet Take 650 mg by mouth every 6 (six) hours as needed.    . ALPRAZolam (XANAX) 0.5 MG tablet Take 0.5 mg by mouth at bedtime as needed for anxiety.    Marland Kitchen. atorvastatin (LIPITOR) 20 MG tablet Take 20 mg by mouth daily.    . cetirizine (ZYRTEC) 10 MG tablet Take 10 mg by mouth as needed for allergies.    Marland Kitchen. docusate sodium (COLACE) 100 MG capsule Take 100 mg by mouth 2 (two) times daily.    . Fish Meal POWD by Does not apply route.    Marland Kitchen. levothyroxine (SYNTHROID, LEVOTHROID) 25 MCG tablet Take 25 mcg by mouth daily before breakfast.    . Pyridoxine HCl (VITAMIN B-6) 500 MG tablet Take 500 mg by mouth daily.      Results for orders placed or performed during the hospital encounter of 08/08/14 (from the past 48 hour(s))  Hemoglobin-hemacue, POC     Status: None   Collection Time: 08/08/14  8:42 AM  Result Value Ref Range   Hemoglobin 14.7 12.0 - 15.0 g/dL   No results found.  Review of Systems  Respiratory: Negative.   Gastrointestinal: Negative.   Genitourinary: Negative.   Psychiatric/Behavioral: Negative.     Blood pressure 157/73, pulse 58,  temperature 97.7 F (36.5 C), temperature source Oral, resp. rate 16, height 5\' 3"  (1.6 m), weight 57.153 kg (126 lb), SpO2 100 %. Physical Exam left carpal tunnel syndrome with positive Phalen's Tinel's and median nerve compression test The patient is alert and oriented in no acute distress. The patient complains of pain in the affected upper extremity.  The patient is noted to have a normal HEENT exam. Lung fields show equal chest expansion and no shortness of breath. Abdomen exam is nontender without distention. Lower extremity examination does not show any fracture dislocation or blood clot symptoms. Pelvis is stable and the neck and back are stable and nontender. Assessment/Plan Plan left carpal tunnel release  We are planning surgery for your upper extremity. The risk and benefits of surgery to include risk of bleeding, infection, anesthesia,  damage to normal structures and failure of the surgery to accomplish its intended goals of relieving symptoms and restoring function have been discussed in detail. With this in mind we plan to proceed. I have specifically discussed with the patient the pre-and postoperative regime and the dos and don'ts and risk and benefits in great detail. Risk and benefits of surgery also include risk of dystrophy(CRPS), chronic nerve pain, failure of the healing process to go  onto completion and other inherent risks of surgery The relavent the pathophysiology of the disease/injury process, as well as the alternatives for treatment and postoperative course of action has been discussed in great detail with the patient who desires to proceed.  We will do everything in our power to help you (the patient) restore function to the upper extremity. It is a pleasure to see this patient today.   Audrey Hatfield,Rochelle Nephew M 08/08/2014, 10:01 AM

## 2014-08-08 NOTE — Anesthesia Preprocedure Evaluation (Signed)
Anesthesia Evaluation  Patient identified by MRN, date of birth, ID band Patient awake    Reviewed: Allergy & Precautions, NPO status , Patient's Chart, lab work & pertinent test results  Airway        Dental   Pulmonary neg pulmonary ROS,          Cardiovascular negative cardio ROS      Neuro/Psych PSYCHIATRIC DISORDERS Anxiety negative neurological ROS     GI/Hepatic negative GI ROS, Neg liver ROS,   Endo/Other  negative endocrine ROS  Renal/GU negative Renal ROS  negative genitourinary   Musculoskeletal  (+) Arthritis -,   Abdominal   Peds negative pediatric ROS (+)  Hematology negative hematology ROS (+)   Anesthesia Other Findings   Reproductive/Obstetrics negative OB ROS                             Anesthesia Physical Anesthesia Plan  ASA: III  Anesthesia Plan: MAC   Post-op Pain Management:    Induction: Intravenous  Airway Management Planned: Simple Face Mask  Additional Equipment:   Intra-op Plan:   Post-operative Plan:   Informed Consent: I have reviewed the patients History and Physical, chart, labs and discussed the procedure including the risks, benefits and alternatives for the proposed anesthesia with the patient or authorized representative who has indicated his/her understanding and acceptance.   Dental advisory given  Plan Discussed with: CRNA, Anesthesiologist and Surgeon  Anesthesia Plan Comments:         Anesthesia Quick Evaluation

## 2014-08-11 ENCOUNTER — Encounter (HOSPITAL_BASED_OUTPATIENT_CLINIC_OR_DEPARTMENT_OTHER): Payer: Self-pay | Admitting: Orthopedic Surgery

## 2014-08-11 NOTE — Op Note (Signed)
NAMMarland Kitchen:  Narda AmberJAMES, Fredrika                ACCOUNT NO.:  [id number]  MEDICAL RECORD NO.:  [id number]  LOCATION:                                 FACILITY:  PHYSICIAN:  Dionne AnoWilliam M. Tarisa Paola, M.D.DATE OF BIRTH:  10-26-1945  DATE OF PROCEDURE:  08/08/2014 DATE OF DISCHARGE:  08/08/2014                              OPERATIVE REPORT   PREOPERATIVE DIAGNOSIS:  Left carpal tunnel syndrome.  POSTOPERATIVE DIAGNOSIS:  Left carpal tunnel syndrome.  PROCEDURE: 1. Left median nerve/peripheral nerve block, wrist forearm level for     anesthetic purposes for carpal tunnel release. 2. Left limited open carpal tunnel release.  SURGEON:  Dionne AnoWilliam M. Amanda PeaGramig, M.D.  ASSISTANT:  None.  COMPLICATIONS:  None.  ANESTHESIA:  Peripheral nerve block with IV sedation keeping the patient awake, alert, and oriented the entire case.  TOURNIQUET TIME:  Less than 10 minutes.  INDICATION:  This patient is a pleasant female who presents with the above-mentioned diagnosis.  I have counseled her in regard to risks and benefits of the surgery and she desires to proceed with the above- mentioned operative intervention.  OPERATION IN DETAIL:  The patient was seen by myself and Anesthesia. Underwent smooth induction of peripheral nerve/median nerve block, prepped and draped in usual sterile fashion with Betadine scrub and paint.  Once this was done, time-out was called.  Pre and postop check was complete and antibiotics received.  Following this, an incision was made 1 to 1.5 cm at the distal edge of the transverse carpal ligament. The patient tolerated this well.  Following this, very careful and cautious dissection was carried out from the distal end.  We identified the transverse carpal ligament, released it distally without difficulty. Fat pad egressed nicely.  There were no complications.  Following this, distal and proximal dissection was carried out until adequate room was available for canal preparatory device 1,  2, and 3, which were placed just under the proximal leading leaflet of the transverse carpal ligament.  Following this, the security clip was placed, obturator disengaged, and security knife was placed and security clip effectively releasing the proximal leaflet of the transverse carpal ligament.  The patient tolerated this well.  Following this, the patient had the canal identified, it looked excellent.  There were no complicating features. The patient was awake, alert, and oriented the entire case.  There were no complicating issues, features, or other problems.  The patient was fully decompressed without difficulty.  Once the decompression was accomplished, the patient then underwent irrigation and ultimately wound closure with 4-0 Prolene.  Sterile dressing was applied.  She tolerated the procedure well.  She will be discharged after a period of observatory care in the PACU and return to the office in 7 days.  Norco written for pain.  No complicating features.  This was an uncomplicated carpal tunnel release.     Dionne AnoWilliam M. Amanda PeaGramig, M.D.     [facility]anyWMG/MEDQ  D:  08/08/2014  T:  08/09/2014  Job:  [id number]

## 2017-10-03 ENCOUNTER — Ambulatory Visit: Payer: Medicare Other | Attending: Orthopedic Surgery | Admitting: Physical Therapy

## 2017-10-03 DIAGNOSIS — M25562 Pain in left knee: Secondary | ICD-10-CM | POA: Diagnosis present

## 2017-10-03 DIAGNOSIS — G8929 Other chronic pain: Secondary | ICD-10-CM | POA: Diagnosis present

## 2017-10-03 NOTE — Therapy (Signed)
[facility] [address] [place], [place], [address] Phone: [phone]   Fax:  [phone]  Physical Therapy Evaluation  Patient Details  Name: Audrey Hatfield MRN: [id number] Date of Birth: Nov 24, 1945 Referring Provider: Samson Frederic, MD   Encounter Date: 10/03/2017  PT End of Session - 10/03/17 1204    Visit Number  1    Number of Visits  8    Date for PT Re-Evaluation  11/07/17    PT Start Time  1118    PT Stop Time  1203    PT Time Calculation (min)  45 min    Activity Tolerance  Patient tolerated treatment well    Behavior During Therapy  [facility] for tasks assessed/performed       Past Medical History:  Diagnosis Date  . Anxiety   . Arthritis   . High cholesterol   . Seasonal allergies   . Thyroid disease   . Wears glasses   . Wears partial dentures     Past Surgical History:  Procedure Laterality Date  . ABDOMINAL HYSTERECTOMY    . CARPAL TUNNEL RELEASE Left 08/08/2014   Procedure: LEFT CARPAL TUNNEL RELEASE;  Surgeon: Dominica Severin, MD;  Location: [facility];  Service: Orthopedics;  Laterality: Left;  . COLONOSCOPY    . SHOULDER ARTHROSCOPY  2010   right  . SHOULDER ARTHROSCOPY  2003   left    There were no vitals filed for this visit.   Subjective Assessment - 10/03/17 1242    Subjective  Patient arrives to physical therapy with reports of left medial knee pain that began due to an unknown cause around October 2018. Patient reported having two rounds of cortisone shots which helped but the pain persisted. Patient reports pain comes and goes and does not limit her ADLs or work activities. Patient reports she cannot pinpoint an activity that causes the pain to arise, "it just happens." Patient reports pain at worst is 5/10 and pain at best is 0/10 with gel cream medication. Patient would like to decrease pain and return to PLOF.    Pertinent History  Previous R shoulder surgery    Currently in Pain?  Yes     Pain Score  3     Pain Location  Knee    Pain Orientation  Left;Medial    Pain Descriptors / Indicators  Sore    Pain Type  Chronic pain    Pain Onset  More than a month ago    Pain Frequency  Occasional    Pain Relieving Factors  prescription pain cream    Effect of Pain on Daily Activities  None         [facility] PT Assessment - 10/03/17 0001      Assessment   Medical Diagnosis  Pes anserinus bursitius of left knee    Referring Provider  Samson Frederic, MD    Onset Date/Surgical Date  -- 6 months ago    Next MD Visit  N/A    Prior Therapy  Yes for shoulder      Precautions   Precautions  None      Balance Screen   Has the patient fallen in the past 6 months  Yes    How many times?  1    Has the patient had a decrease in activity level because of a fear of falling?   No    Is the patient reluctant to leave their home because of a fear of falling?  No      Home Public house managernvironment   Living Environment  Private residence    Home Access  Stairs to enter    Entrance Stairs-Number of Steps  3      Prior Function   Level of Independence  Independent      Observation/Other Assessments   Focus on Therapeutic Outcomes (FOTO)   25% limited      Observation/Other Assessments-Edema    Edema  Circumferential 2.5 cm difference; 34.5 cm L> 32 cm R      ROM / Strength   AROM / PROM / Strength  AROM;Strength      AROM   Overall AROM   Within functional limits for tasks performed    AROM Assessment Site  Knee    Right/Left Knee  Left    Left Knee Extension  0    Left Knee Flexion  125      Strength   Overall Strength  Within functional limits for tasks performed    Strength Assessment Site  Knee;Hip    Right/Left Hip  Left    Left Hip Flexion  4/5    Left Hip ABduction  4/5    Right/Left Knee  Left    Left Knee Flexion  4+/5    Left Knee Extension  4+/5      Palpation   Patella mobility  3/6, WFL    Palpation comment  tender to palpation long medial aspect of knee and at  medial joint line      Transfers   Transfers  Independent with all Transfers      Ambulation/Gait   Gait Pattern  Within Functional Limits                Objective measurements completed on examination: See above findings.              PT Education - 10/03/17 1244    Education Details  SLR, SLR with ER, clam shells with yellow band    Person(s) Educated  Patient    Methods  Explanation;Demonstration;Handout    Comprehension  Returned demonstration;Verbalized understanding          PT Long Term Goals - 10/03/17 1246      PT LONG TERM GOAL #1   Title  Patient will be independent with HEP    Time  4    Period  Weeks    Status  New      PT LONG TERM GOAL #2   Title  Patient will improve L knee strength to 5/5 in all planes to improve stability during work activities.     Time  4    Period  Weeks    Status  New      PT LONG TERM GOAL #3   Title  Patient will improve L hip abduction and extension strength to 4+/5 or greater to improve stability during work activities.    Time  4    Period  Weeks    Status  New      PT LONG TERM GOAL #4   Title  Patient will work full 9 hour shift and perfrom ADLs with less than 3/10 pain in L knee.    Time  4    Period  Weeks    Status  New             Plan - 10/03/17 1244    Clinical Impression Statement  Patient is a 72 year old female who presents to  physical therapy with reports of intermittent L knee pain. Patient demonstrates full L knee AROM and good strength knee strength. Patient noted with decreased hip abduction and hip extension strength. Patient is tender to palpation along the medial, and posteriomedial aspect of the knee as well as the medial joint line. (-) Romberg. Patient would benefit from skilled physical therapy to decrease pain and address deficits.    Clinical Presentation  Stable    Clinical Decision Making  Low    Rehab Potential  Excellent    PT Frequency  2x / week    PT Duration   4 weeks    PT Treatment/Interventions  ADLs/Self Care Home Management;Iontophoresis 4mg /ml Dexamethasone;Electrical Stimulation;Cryotherapy;Moist Heat;Ultrasound;Therapeutic exercise;Therapeutic activities;Neuromuscular re-education;Patient/family education;Manual techniques;Passive range of motion;Vasopneumatic Device;Taping    PT Next Visit Plan  Nustep, pain free knee and hip strengthening, Vaso & E-Stim    PT Home Exercise Plan  SLR, SLR with ER, Clamshells    Consulted and Agree with Plan of Care  Patient       Patient will benefit from skilled therapeutic intervention in order to improve the following deficits and impairments:  Pain, Decreased strength, Increased edema  Visit Diagnosis: Chronic pain of left knee     Problem List There are no active problems to display for this patient.   [name], PT, DPT 10/03/2017, 12:48 PM  [facility] [address] [place], [place], [address] Phone: [phone]   Fax:  [phone]  Name: Nickey Canedo MRN: [id number] Date of Birth: May 09, 1945

## 2017-10-03 NOTE — Patient Instructions (Signed)
   Kasin Tonkinson, PT, DPT [facility] [address] [place], [place], [address] Phone: [phone]   Fax:  [phone]  

## 2017-10-09 ENCOUNTER — Ambulatory Visit: Payer: Medicare Other | Admitting: Physical Therapy

## 2017-10-09 ENCOUNTER — Encounter: Payer: Self-pay | Admitting: Physical Therapy

## 2017-10-09 DIAGNOSIS — M25562 Pain in left knee: Secondary | ICD-10-CM | POA: Diagnosis not present

## 2017-10-09 DIAGNOSIS — G8929 Other chronic pain: Secondary | ICD-10-CM

## 2017-10-09 NOTE — Therapy (Signed)
[facility] [address] [place], [place], [address] Phone: [phone]   Fax:  [phone]  Physical Therapy Treatment  Patient Details  Name: Audrey Hatfield MRN: [id number] Date of Birth: June 17, 1945 Referring Provider: Samson FredericBrian Swinteck, MD   Encounter Date: 10/09/2017  PT End of Session - 10/09/17 1022    Visit Number  2    Number of Visits  8    Date for PT Re-Evaluation  11/07/17    PT Start Time  0946    PT Stop Time  1038    PT Time Calculation (min)  52 min    Activity Tolerance  Patient tolerated treatment well    Behavior During Therapy  [facility] for tasks assessed/performed       Past Medical History:  Diagnosis Date  . Anxiety   . Arthritis   . High cholesterol   . Seasonal allergies   . Thyroid disease   . Wears glasses   . Wears partial dentures     Past Surgical History:  Procedure Laterality Date  . ABDOMINAL HYSTERECTOMY    . CARPAL TUNNEL RELEASE Left 08/08/2014   Procedure: LEFT CARPAL TUNNEL RELEASE;  Surgeon: Dominica SeverinWilliam Gramig, MD;  Location: [facility];  Service: Orthopedics;  Laterality: Left;  . COLONOSCOPY    . SHOULDER ARTHROSCOPY  2010   right  . SHOULDER ARTHROSCOPY  2003   left    There were no vitals filed for this visit.  Subjective Assessment - 10/09/17 0954    Subjective  Patient arrived with minimal pain complaints today    Pertinent History  Previous R shoulder surgery    Currently in Pain?  Yes    Pain Score  2     Pain Location  Knee    Pain Orientation  Left;Medial;Posterior    Pain Descriptors / Indicators  Sore    Pain Type  Chronic pain    Pain Onset  More than a month ago    Pain Frequency  Intermittent    Aggravating Factors   unsure     Pain Relieving Factors  pain cream/rest                       [facility] Adult PT Treatment/Exercise - 10/09/17 0001      Exercises   Exercises  Knee/Hip      Knee/Hip Exercises: Aerobic   Nustep  10min L3 UE/LE,  monitored      Knee/Hip Exercises: Standing   Hip Abduction  Stengthening;Left;2 sets;10 reps;Knee straight    Abduction Limitations  red t-band    Hip Extension  Stengthening;Left;2 sets;10 reps;Knee straight    Extension Limitations  red t-band    Lateral Step Up  Left;2 sets;10 reps;Step Height: 6"    Forward Step Up  Left;2 sets;10 reps;Step Height: 6"      Knee/Hip Exercises: Seated   Long Arc Quad  Strengthening;Left;3 sets;10 reps;Weights    Long Arc Quad Weight  4 lbs.    Hamstring Curl  Strengthening;Left;20 reps    Hamstring Limitations  green t-band    Sit to Sand  20 reps;without UE support      Knee/Hip Exercises: Supine   Bridges  Strengthening;Both;20 reps    Straight Leg Raises  Strengthening;Left;20 reps    Other Supine Knee/Hip Exercises  hib abd supine left LE with red t-band x20      Modalities   Modalities  Vasopneumatic;Electrical Stimulation      Electrical Stimulation  Electrical Stimulation Location  left medial/post knee    Engineer, manufacturing  IFC    Electrical Stimulation Parameters  1-10hz  x68min      Vasopneumatic   Number Minutes Vasopneumatic   15 minutes    Vasopnuematic Location   Knee    Vasopneumatic Pressure  Low                  PT Long Term Goals - 10/09/17 1023      PT LONG TERM GOAL #1   Title  Patient will be independent with HEP    Time  4    Period  Weeks    Status  On-going      PT LONG TERM GOAL #2   Title  Patient will improve L knee strength to 5/5 in all planes to improve stability during work activities.     Time  4    Period  Weeks    Status  On-going      PT LONG TERM GOAL #3   Title  Patient will improve L hip abduction and extension strength to 4+/5 or greater to improve stability during work activities.    Time  4    Period  Weeks    Status  On-going      PT LONG TERM GOAL #4   Title  Patient will work full 9 hour shift and perfrom ADLs with less than 3/10 pain in L knee.    Time  4     Period  Weeks    Status  On-going            Plan - 10/09/17 1023    Clinical Impression Statement  Patient tolerated treatment well today. Patient able to progress with left LE exercises with no increased discomfort. Patient has very palpable pain left posterior medial knee. Patient reported doing good with initial HEP. Goals ongoing.     Rehab Potential  Excellent    PT Frequency  2x / week    PT Duration  4 weeks    PT Treatment/Interventions  ADLs/Self Care Home Management;Iontophoresis 4mg /ml Dexamethasone;Electrical Stimulation;Cryotherapy;Moist Heat;Ultrasound;Therapeutic exercise;Therapeutic activities;Neuromuscular re-education;Patient/family education;Manual techniques;Passive range of motion;Vasopneumatic Device;Taping    PT Next Visit Plan  cont with Nustep, pain free knee and hip strengthening, Vaso & E-Stim    Consulted and Agree with Plan of Care  Patient       Patient will benefit from skilled therapeutic intervention in order to improve the following deficits and impairments:  Pain, Decreased strength, Increased edema  Visit Diagnosis: Chronic pain of left knee     Problem List There are no active problems to display for this patient.   Audrey Hatfield, PTA 10/09/2017, 10:42 AM  [facility] [address] [place], [place], [address] Phone: [phone]   Fax:  [phone]  Name: Audrey Hatfield MRN: [id number] Date of Birth: 1945-07-22

## 2017-10-17 ENCOUNTER — Encounter: Payer: Self-pay | Admitting: Physical Therapy

## 2017-10-17 ENCOUNTER — Ambulatory Visit: Payer: Medicare Other | Admitting: Physical Therapy

## 2017-10-17 DIAGNOSIS — M25562 Pain in left knee: Principal | ICD-10-CM

## 2017-10-17 DIAGNOSIS — G8929 Other chronic pain: Secondary | ICD-10-CM

## 2017-10-17 NOTE — Therapy (Signed)
[facility] [address] [place], [place], [address] Phone: [phone]   Fax:  [phone]  Physical Therapy Treatment  Patient Details  Name: Audrey Hatfield MRN: [id number] Date of Birth: 05-Dec-1945 Referring Provider: Samson FredericBrian Swinteck, MD   Encounter Date: 10/17/2017  PT End of Session - 10/17/17 1119    Visit Number  3    Number of Visits  8    Date for PT Re-Evaluation  11/07/17    PT Start Time  1030    PT Stop Time  1121    PT Time Calculation (min)  51 min    Activity Tolerance  Patient tolerated treatment well    Behavior During Therapy  [facility] for tasks assessed/performed       Past Medical History:  Diagnosis Date  . Anxiety   . Arthritis   . High cholesterol   . Seasonal allergies   . Thyroid disease   . Wears glasses   . Wears partial dentures     Past Surgical History:  Procedure Laterality Date  . ABDOMINAL HYSTERECTOMY    . CARPAL TUNNEL RELEASE Left 08/08/2014   Procedure: LEFT CARPAL TUNNEL RELEASE;  Surgeon: Dominica SeverinWilliam Gramig, MD;  Location: [facility];  Service: Orthopedics;  Laterality: Left;  . COLONOSCOPY    . SHOULDER ARTHROSCOPY  2010   right  . SHOULDER ARTHROSCOPY  2003   left    There were no vitals filed for this visit.  Subjective Assessment - 10/17/17 1146    Subjective  Patient reported feeling alright with just minimal pain in the left medial knee.    Pertinent History  Previous R shoulder surgery    Currently in Pain?  Yes    Pain Score  2     Pain Orientation  Left;Medial    Pain Descriptors / Indicators  Sore    Pain Type  Chronic pain    Pain Onset  More than a month ago    Pain Frequency  Intermittent                       [facility] Adult PT Treatment/Exercise - 10/17/17 0001      Knee/Hip Exercises: Aerobic   Stationary Bike  Level 3 x15 minutes      Knee/Hip Exercises: Standing   Hip Abduction  Stengthening;Left;2 sets;10 reps;Knee straight    Abduction Limitations  red t-band    Hip Extension  Stengthening;Left;2 sets;10 reps;Knee straight    Extension Limitations  red t-band    Forward Step Up  Left;2 sets;10 reps;Hand Hold: 2;Step Height: 6"    Rocker Board  3 minutes      Knee/Hip Exercises: Seated   Long Arc Quad  Strengthening;Left;2 sets;10 reps;Weights    Long Arc Quad Weight  4 lbs.    Hamstring Curl  Strengthening;Left;20 reps    Hamstring Limitations  red theraband      Modalities   Modalities  Vasopneumatic;Electrical Stimulation      Electrical Stimulation   Electrical Stimulation Location  left medial/post knee    Electrical Stimulation Action  IFC    Electrical Stimulation Parameters  1-10 hz x15 min    Electrical Stimulation Goals  Pain      Vasopneumatic   Number Minutes Vasopneumatic   15 minutes    Vasopnuematic Location   Knee    Vasopneumatic Pressure  Low  PT Long Term Goals - 10/09/17 1023      PT LONG TERM GOAL #1   Title  Patient will be independent with HEP    Time  4    Period  Weeks    Status  On-going      PT LONG TERM GOAL #2   Title  Patient will improve L knee strength to 5/5 in all planes to improve stability during work activities.     Time  4    Period  Weeks    Status  On-going      PT LONG TERM GOAL #3   Title  Patient will improve L hip abduction and extension strength to 4+/5 or greater to improve stability during work activities.    Time  4    Period  Weeks    Status  On-going      PT LONG TERM GOAL #4   Title  Patient will work full 9 hour shift and perfrom ADLs with less than 3/10 pain in L knee.    Time  4    Period  Weeks    Status  On-going            Plan - 10/17/17 1110    Clinical Impression Statement  Patient was able to tolerate treatment well. Patient reported some left medial knee discomfort with hamstring curls and green band. Red band was used and patient reported no knee discomfort. Patient demonstrated good form  with all exercises after explanation. Normal response to modalities upon removal.    Clinical Presentation  Stable    Clinical Decision Making  Low    Rehab Potential  Excellent    PT Frequency  2x / week    PT Duration  4 weeks    PT Treatment/Interventions  ADLs/Self Care Home Management;Iontophoresis 4mg /ml Dexamethasone;Electrical Stimulation;Cryotherapy;Moist Heat;Ultrasound;Therapeutic exercise;Therapeutic activities;Neuromuscular re-education;Patient/family education;Manual techniques;Passive range of motion;Vasopneumatic Device;Taping    PT Next Visit Plan  cont with Nustep, pain free knee and hip strengthening, Vaso & E-Stim    Consulted and Agree with Plan of Care  Patient       Patient will benefit from skilled therapeutic intervention in order to improve the following deficits and impairments:  Pain, Decreased strength, Increased edema  Visit Diagnosis: Chronic pain of left knee     Problem List There are no active problems to display for this patient.  [name], PT, DPT 10/17/2017, 11:48 AM  [facility] [address] [place], [place], [address] Phone: [phone]   Fax:  [phone]  Name: Audrey Hatfield MRN: [id number] Date of Birth: 12-04-1945

## 2017-10-23 ENCOUNTER — Encounter: Payer: Self-pay | Admitting: Physical Therapy

## 2017-10-23 ENCOUNTER — Ambulatory Visit: Payer: Medicare Other | Admitting: Physical Therapy

## 2017-10-23 DIAGNOSIS — M25562 Pain in left knee: Secondary | ICD-10-CM | POA: Diagnosis not present

## 2017-10-23 DIAGNOSIS — G8929 Other chronic pain: Secondary | ICD-10-CM

## 2017-10-23 NOTE — Therapy (Signed)
[facility] [address] [place], [place], [address] Phone: [phone]   Fax:  [phone]  Physical Therapy Treatment  Patient Details  Name: Audrey Hatfield MRN: [id number] Date of Birth: [birth year] Referring Provider: Samson Frederic, MD   Encounter Date: 10/23/2017  PT End of Session - 10/23/17 1022    Visit Number  4    Number of Visits  8    Date for PT Re-Evaluation  11/07/17    PT Start Time  0946    PT Stop Time  1036    PT Time Calculation (min)  50 min    Activity Tolerance  Patient tolerated treatment well    Behavior During Therapy  [facility] for tasks assessed/performed       Past Medical History:  Diagnosis Date  . Anxiety   . Arthritis   . High cholesterol   . Seasonal allergies   . Thyroid disease   . Wears glasses   . Wears partial dentures     Past Surgical History:  Procedure Laterality Date  . ABDOMINAL HYSTERECTOMY    . CARPAL TUNNEL RELEASE Left 08/08/2014   Procedure: LEFT CARPAL TUNNEL RELEASE;  Surgeon: Dominica Severin, MD;  Location: [facility];  Service: Orthopedics;  Laterality: Left;  . COLONOSCOPY    . SHOULDER ARTHROSCOPY  2010   right  . SHOULDER ARTHROSCOPY  2003   left    There were no vitals filed for this visit.  Subjective Assessment - 10/23/17 0950    Subjective  Patient has some ongoing pain discomfort, yet overall doing ok after treatments    Pertinent History  Previous R shoulder surgery    Currently in Pain?  Yes    Pain Score  5     Pain Location  Knee    Pain Orientation  Left;Medial    Pain Descriptors / Indicators  Sore    Pain Onset  More than a month ago    Pain Frequency  Intermittent    Aggravating Factors   standing 9 hour shift at work    Pain Relieving Factors  pain cream / rest                       [facility] Adult PT Treatment/Exercise - 10/23/17 0001      Knee/Hip Exercises: Aerobic   Nustep  L4 UE/LE      Modalities   Modalities  Electrical Stimulation;Vasopneumatic;Ultrasound      Insurance claims handler Stimulation Location  left medial/post knee    Electrical Stimulation Action  IFC    Electrical Stimulation Parameters  1-10hz  x32min    Electrical Stimulation Goals  Pain      Ultrasound   Ultrasound Location  left medial knee    Ultrasound Parameters  1.5w/cm2/50% x73min      Vasopneumatic   Number Minutes Vasopneumatic   15 minutes    Vasopnuematic Location   Knee    Vasopneumatic Pressure  Low      Manual Therapy   Manual Therapy  Myofascial release;Soft tissue mobilization    Soft tissue mobilization  gentle manual STW to left medial knee to decrease pain                  PT Long Term Goals - 10/09/17 1023      PT LONG TERM GOAL #1   Title  Patient will be independent with HEP    Time  4  Period  Weeks    Status  On-going      PT LONG TERM GOAL #2   Title  Patient will improve L knee strength to 5/5 in all planes to improve stability during work activities.     Time  4    Period  Weeks    Status  On-going      PT LONG TERM GOAL #3   Title  Patient will improve L hip abduction and extension strength to 4+/5 or greater to improve stability during work activities.    Time  4    Period  Weeks    Status  On-going      PT LONG TERM GOAL #4   Title  Patient will work full 9 hour shift and perfrom ADLs with less than 3/10 pain in L knee.    Time  4    Period  Weeks    Status  On-going            Plan - 10/23/17 1025    Clinical Impression Statement  Patient tolerated treatment fair today. patient has some increased pain for unknown reason, but posibly a 9 hour shift at work. Today focused on gentle strengthening and modalities to reduce pain. Goals ongoing.    Rehab Potential  Excellent    PT Frequency  2x / week    PT Duration  4 weeks    PT Treatment/Interventions  ADLs/Self Care Home Management;Iontophoresis 4mg /ml Dexamethasone;Electrical  Stimulation;Cryotherapy;Moist Heat;Ultrasound;Therapeutic exercise;Therapeutic activities;Neuromuscular re-education;Patient/family education;Manual techniques;Passive range of motion;Vasopneumatic Device;Taping    PT Next Visit Plan  cont with Nustep, pain free knee and hip strengthening/modalities PRN    Consulted and Agree with Plan of Care  Patient       Patient will benefit from skilled therapeutic intervention in order to improve the following deficits and impairments:  Pain, Decreased strength, Increased edema  Visit Diagnosis: Chronic pain of left knee     Problem List There are no active problems to display for this patient.   Hermelinda DellenDUNFORD, Dawon Troop P, PTA 10/23/2017, 10:40 AM  [facility] [address] [place], [place], [address] Phone: [phone]   Fax:  [phone]  Name: Narda AmberGloria Arai MRN: [id number] Date of Birth: 1946/02/23

## 2017-10-30 ENCOUNTER — Other Ambulatory Visit: Payer: Self-pay | Admitting: Physician Assistant

## 2017-10-30 ENCOUNTER — Ambulatory Visit: Payer: Medicare Other | Admitting: Physical Therapy

## 2017-11-03 ENCOUNTER — Ambulatory Visit: Payer: Medicare Other | Attending: Orthopedic Surgery | Admitting: Physical Therapy

## 2017-11-03 ENCOUNTER — Encounter: Payer: Self-pay | Admitting: Physical Therapy

## 2017-11-03 DIAGNOSIS — M25562 Pain in left knee: Secondary | ICD-10-CM | POA: Insufficient documentation

## 2017-11-03 DIAGNOSIS — G8929 Other chronic pain: Secondary | ICD-10-CM

## 2017-11-03 NOTE — Therapy (Addendum)
[facility] [place], [place], [address] Phone: [phone]   Fax:  [phone]  Physical Therapy Treatment  Patient Details  Name: Audrey Hatfield MRN: [id number] Date of Birth: 1946-04-10 Referring Provider: Rod Can, MD   Encounter Date: 11/03/2017  PT End of Session - 11/03/17 0754    Visit Number  5    Number of Visits  12    Date for PT Re-Evaluation  11/29/17    PT Start Time  0730    PT Stop Time  0821    PT Time Calculation (min)  51 min    Activity Tolerance  Patient tolerated treatment well    Behavior During Therapy  [facility] for tasks assessed/performed       Past Medical History:  Diagnosis Date  . Anxiety   . Arthritis   . High cholesterol   . Seasonal allergies   . Thyroid disease   . Wears glasses   . Wears partial dentures     Past Surgical History:  Procedure Laterality Date  . ABDOMINAL HYSTERECTOMY    . CARPAL TUNNEL RELEASE Left 08/08/2014   Procedure: LEFT CARPAL TUNNEL RELEASE;  Surgeon: Roseanne Kaufman, MD;  Location: [facility];  Service: Orthopedics;  Laterality: Left;  . COLONOSCOPY    . SHOULDER ARTHROSCOPY  2010   right  . SHOULDER ARTHROSCOPY  2003   left    There were no vitals filed for this visit.  Subjective Assessment - 11/03/17 0737    Subjective  Patient reported feeling fine. She stated she went for a follow up and stated she is waiting for her MRI results and her pain in the back of her knee is due to a baker's cyst.    Pertinent History  Previous R shoulder surgery    Currently in Pain?  No/denies         [facility] PT Assessment - 11/03/17 0001      Assessment   Medical Diagnosis  Pes anserinus bursitius of left knee                   [facility] Adult PT Treatment/Exercise - 11/03/17 0001      Knee/Hip Exercises: Aerobic   Nustep  17 min L4 UE/LE      Knee/Hip Exercises: Supine   Bridges  Strengthening;Both;20 reps    Straight Leg Raises   Strengthening;Left;20 reps      Modalities   Modalities  Electrical Stimulation;Vasopneumatic;Ultrasound      Acupuncturist Stimulation Location  left medial knee    Electrical Stimulation Action  Pre-mod    Electrical Stimulation Parameters  80-150 hz x10 min    Electrical Stimulation Goals  Pain      Ultrasound   Ultrasound Location  left medial knee    Ultrasound Parameters  1.5 w/cm2, 50%, 3 mHz x8 mins    Ultrasound Goals  Pain;Edema      Vasopneumatic   Number Minutes Vasopneumatic   10 minutes    Vasopnuematic Location   Knee    Vasopneumatic Pressure  Low                  PT Long Term Goals - 11/03/17 1025      PT LONG TERM GOAL #1   Title  Patient will be independent with HEP    Time  4    Period  Weeks    Status  On-going      PT  LONG TERM GOAL #2   Title  Patient will improve L knee strength to 5/5 in all planes to improve stability during work activities.     Time  4    Period  Weeks    Status  On-going      PT LONG TERM GOAL #3   Title  Patient will improve L hip abduction and extension strength to 4+/5 or greater to improve stability during work activities.    Time  4    Period  Weeks    Status  On-going      PT LONG TERM GOAL #4   Title  Patient will work full 9 hour shift and perfrom ADLs with less than 3/10 pain in L knee.    Time  4    Period  Weeks    Status  On-going reports 5/10 pain while working            Plan - 11/03/17 0813    Clinical Impression Statement  Patient was able to tolerate treatment well with no reports of pain. Patient required verbal cuing to keep knee straight during straight leg raises. Patient states therapy has been helping but states she still continues to experience pain. Normal response to modalities after removal.     Clinical Presentation  Stable    Clinical Decision Making  Low    Rehab Potential  Excellent    PT Frequency  2x / week    PT Duration  4 weeks    PT  Treatment/Interventions  ADLs/Self Care Home Management;Iontophoresis 52m/ml Dexamethasone;Electrical Stimulation;Cryotherapy;Moist Heat;Ultrasound;Therapeutic exercise;Therapeutic activities;Neuromuscular re-education;Patient/family education;Manual techniques;Passive range of motion;Vasopneumatic Device;Taping    PT Next Visit Plan  cont with Nustep, pain free knee and hip strengthening/modalities PRN    Consulted and Agree with Plan of Care  Patient       Patient will benefit from skilled therapeutic intervention in order to improve the following deficits and impairments:  Pain, Decreased strength, Increased edema  Visit Diagnosis: Chronic pain of left knee - Plan: PT plan of care cert/re-cert     Problem List There are no active problems to display for this patient.   PHYSICAL THERAPY DISCHARGE SUMMARY  Visits from Start of Care: 5  Current functional level related to goals / functional outcomes: See above   Remaining deficits: Goals not met   Education / Equipment: HEP Plan: Patient agrees to discharge.  Patient goals were not met. Patient is being discharged due to not returning since the last visit.  ?????       KGabriela Eves PT, DPT 11/03/2017, 12:11 PM  [facility] [place] [address]Phone: [phone]  Fax:  [phone] Name: Audrey HochsteinMRN: [id number] of Birth: 3September 20, 1947

## 2017-11-03 NOTE — Addendum Note (Signed)
Addended by: Guss BundeMANGAWANG, Whitney Hillegass on: 11/03/2017 12:11 PM   Modules accepted: Orders

## 2018-02-16 ENCOUNTER — Telehealth: Payer: Self-pay

## 2018-02-16 NOTE — Telephone Encounter (Signed)
SENT REFERRAL TO SCHEDULING AND FILED NOTES 

## 2018-03-16 ENCOUNTER — Ambulatory Visit: Payer: Medicare Other | Admitting: Internal Medicine

## 2018-03-16 ENCOUNTER — Encounter: Payer: Self-pay | Admitting: Internal Medicine

## 2018-03-16 VITALS — BP 140/72 | HR 53 | Ht 63.0 in | Wt 131.8 lb

## 2018-03-16 DIAGNOSIS — R0789 Other chest pain: Secondary | ICD-10-CM | POA: Diagnosis not present

## 2018-03-16 DIAGNOSIS — I350 Nonrheumatic aortic (valve) stenosis: Secondary | ICD-10-CM | POA: Insufficient documentation

## 2018-03-16 NOTE — Patient Instructions (Signed)
Medication Instructions:  Your physician recommends that you continue on your current medications as directed. Please refer to the Current Medication list given to you today.  If you need a refill on your cardiac medications before your next appointment, please call your pharmacy.   Lab work: None ordered If you have labs (blood work) drawn today and your tests are completely normal, you will receive your results only by: Marland Kitchen. MyChart Message (if you have MyChart) OR . A paper copy in the mail If you have any lab test that is abnormal or we need to change your treatment, we will call you to review the results.  Testing/Procedures: Your physician has requested that you have an echocardiogram. Echocardiography is a painless test that uses sound waves to create images of your heart. It provides your doctor with information about the size and shape of your heart and how well your heart's chambers and valves are working. This procedure takes approximately one hour. There are no restrictions for this procedure.    Follow-Up: At [facility], you and your health needs are our priority.  As part of our continuing mission to provide you with exceptional heart care, we have created designated Provider Care Teams.  These Care Teams include your primary Cardiologist (physician) and Advanced Practice Providers (APPs -  Physician Assistants and Nurse Practitioners) who all work together to provide you with the care you need, when you need it. You will need a follow up appointment in 12 months or sooner pending echo results  Please call our office 2 months in advance to schedule this appointment.  You may see  Dr.Acharya or one of the following Advanced Practice Providers on your designated Care Team:   Theodore DemarkRhonda Barrett, PA-C . Joni ReiningKathryn Lawrence, DNP, ANP  Any Other Special Instructions Will Be Listed Below (If Applicable).

## 2018-03-16 NOTE — Progress Notes (Signed)
Cardiology Office Note:    Date:  03/16/2018   ID:  Audrey Hatfield, DOB [birth year], MRN [id number]  PCP:  Audrey Hatfield., PA-C  Cardiologist:  No primary care provider on file.  Electrophysiologist:  None   Referring MD: Audrey Hatfield., PA-C   Murmur and atypical chest pain  History of Present Illness:    Audrey Hatfield is a 72 y.o. female with a hx of arthritis, anxiety, hyperlipidemia, thyroid disease.  She presents today for evaluation of heart murmur on exam from her primary care provider.  She notes an occasional discomfort in her chest which is fleeting.  This occurs once every 2 weeks approximately.  It is not associated with exertion and not relieved by rest.  She associates it with food, and does not have a known history of GERD.  She has not tried antacids.  She takes one Tylenol PM tablet in the evening for sleep and takes 2 ibuprofen tablets in the morning.  We discussed the routine use of Tylenol as well as ibuprofen, and that it should be used sparingly and as needed.  If she requires daily pain control for arthritis, I would prefer that she take less than 3 g of Tylenol per day, and substitute her ibuprofen for Tylenol when able.  We discussed the renal and GI implications of daily ibuprofen use.  She works at an [employer] in [place] from the hours of 5 AM-2 PM and is on her feet for most of that time. She denies dyspnea at rest or with exertion, palpitations, PND, orthopnea, or leg swelling. Denies syncope or presyncope. Denies dizziness or lightheadedness.  Occasional snoring and has not be evaluated for sleep apnea.  Epworth Sleepiness Scale score of 1.  She is a never smoker and does not drink alcohol.  She had 16 brothers and sisters, some of whom had cardiovascular disease that she is uncertain of the diagnoses.  She notes no family history of early MI or sudden cardiac death.  Past Medical History:  Diagnosis Date  . Anxiety   . Arthritis   . High  cholesterol   . Seasonal allergies   . Thyroid disease   . Wears glasses   . Wears partial dentures     Past Surgical History:  Procedure Laterality Date  . ABDOMINAL HYSTERECTOMY    . CARPAL TUNNEL RELEASE Left 08/08/2014   Procedure: LEFT CARPAL TUNNEL RELEASE;  Surgeon: Dominica Severin, MD;  Location: [facility];  Service: Orthopedics;  Laterality: Left;  . COLONOSCOPY    . SHOULDER ARTHROSCOPY  2010   right  . SHOULDER ARTHROSCOPY  2003   left    Current Medications: Current Meds  Medication Sig  . acetaminophen (TYLENOL) 325 MG tablet Take 650 mg by mouth every 6 (six) hours as needed.  . ALPRAZolam (XANAX) 0.5 MG tablet Take 0.5 mg by mouth at bedtime as needed for anxiety.  Marland Kitchen atorvastatin (LIPITOR) 20 MG tablet Take 20 mg by mouth daily.  . cetirizine (ZYRTEC) 10 MG tablet Take 10 mg by mouth as needed for allergies.  Marland Kitchen docusate sodium (COLACE) 100 MG capsule Take 100 mg by mouth daily.   Marland Kitchen levothyroxine (SYNTHROID, LEVOTHROID) 25 MCG tablet Take 25 mcg by mouth daily before breakfast.  . Omega-3 Fatty Acids (FISH OIL) 1000 MG CAPS Take 1 capsule by mouth 2 (two) times daily.     Allergies:   Patient has no known allergies.   Social History   Socioeconomic History  .  Marital status: Divorced    Spouse name: Not on file  . Number of children: Not on file  . Years of education: Not on file  . Highest education level: Not on file  Occupational History  . Not on file  Social Needs  . Financial resource strain: Not on file  . Food insecurity:    Worry: Not on file    Inability: Not on file  . Transportation needs:    Medical: Not on file    Non-medical: Not on file  Tobacco Use  . Smoking status: Never Smoker  . Smokeless tobacco: Never Used  Substance and Sexual Activity  . Alcohol use: No  . Drug use: No  . Sexual activity: Not on file  Lifestyle  . Physical activity:    Days per week: Not on file    Minutes per session: Not on file  .  Stress: Not on file  Relationships  . Social connections:    Talks on phone: Not on file    Gets together: Not on file    Attends religious service: Not on file    Active member of club or organization: Not on file    Attends meetings of clubs or organizations: Not on file    Relationship status: Not on file  Other Topics Concern  . Not on file  Social History Narrative  . Not on file     Family History: The patient's family history includes Heart disease in her father; Suicidality in her mother.  ROS:   Please see the history of present illness.    All other systems reviewed and are negative.  EKGs/Labs/Other Studies Reviewed:    The following studies were reviewed today:  EKG:  EKG is ordered today.  The ekg ordered today demonstrates sinus bradycardia, left anterior fascicular block, ventricular rate 53 bpm.  Recent Labs: No results found for requested labs within last 8760 hours.  Recent Lipid Panel No results found for: CHOL, TRIG, HDL, CHOLHDL, VLDL, LDLCALC, LDLDIRECT  Physical Exam:    VS:  BP 140/72   Pulse (!) 53   Ht 5\' 3"  (1.6 m)   Wt 131 lb 12.8 oz (59.8 kg)   BMI 23.35 kg/m     Wt Readings from Last 3 Encounters:  03/16/18 131 lb 12.8 oz (59.8 kg)  08/08/14 126 lb (57.2 kg)  04/13/14 126 lb (57.2 kg)     Constitutional: No acute distress Eyes: pupils equally round and reactive to light, sclera non-icteric, normal conjunctiva and lids ENMT: normal dentition, moist mucous membranes Cardiovascular: regular rhythm, bradycardic , 3 out of 6 mid peaking systolic ejection murmur heard best over the left upper sternal border, with radiation across the precordium and to the carotid arteries.  Preserved S2.  S1 normal. Radial pulses normal bilaterally. No jugular venous distention.  No carotid bruits. Respiratory: clear to auscultation bilaterally GI : normal bowel sounds, soft and nontender. No distention.   MSK: extremities warm, well perfused. No edema.    NEURO: grossly nonfocal exam, moves all extremities. PSYCH: alert and oriented x 3, normal mood and affect.   ASSESSMENT:    1. Aortic valve stenosis, etiology of cardiac valve disease unspecified   2. Atypical chest pain    PLAN:    1. Aortic valve stenosis, etiology of cardiac valve disease unspecified   2. Atypical chest pain    She has no significant concerns about her cardiopulmonary health, and is not particularly bothered by her chest pain.  I have instructed her to try Tums the next time she gets discomfort in her chest since it follows meals.  She has a systolic ejection murmur which suggests aortic valve stenosis.  We will obtain an echocardiogram to determine the severity of her valve disease, and overall cardiovascular function.  It is likely that we will need to see her in follow-up in 1 year if her aortic valve stenosis is not severe.  Medication Adjustments/Labs and Tests Ordered: Current medicines are reviewed at length with the patient today.  Concerns regarding medicines are outlined above.  Orders Placed This Encounter  Procedures  . EKG 12-Lead  . ECHOCARDIOGRAM COMPLETE   No orders of the defined types were placed in this encounter.   Patient Instructions  Medication Instructions:  Your physician recommends that you continue on your current medications as directed. Please refer to the Current Medication list given to you today.  If you need a refill on your cardiac medications before your next appointment, please call your pharmacy.   Lab work: None ordered If you have labs (blood work) drawn today and your tests are completely normal, you will receive your results only by: Marland Kitchen. MyChart Message (if you have MyChart) OR . A paper copy in the mail If you have any lab test that is abnormal or we need to change your treatment, we will call you to review the results.  Testing/Procedures: Your physician has requested that you have an echocardiogram.  Echocardiography is a painless test that uses sound waves to create images of your heart. It provides your doctor with information about the size and shape of your heart and how well your heart's chambers and valves are working. This procedure takes approximately one hour. There are no restrictions for this procedure.    Follow-Up: At [facility], you and your health needs are our priority.  As part of our continuing mission to provide you with exceptional heart care, we have created designated Provider Care Teams.  These Care Teams include your primary Cardiologist (physician) and Advanced Practice Providers (APPs -  Physician Assistants and Nurse Practitioners) who all work together to provide you with the care you need, when you need it. You will need a follow up appointment in 12 months or sooner pending echo results  Please call our office 2 months in advance to schedule this appointment.  You may see  Dr.Ellia Knowlton or one of the following Advanced Practice Providers on your designated Care Team:   Theodore DemarkRhonda Barrett, PA-C . Joni ReiningKathryn Lawrence, DNP, ANP  Any Other Special Instructions Will Be Listed Below (If Applicable).       Signed, Parke PoissonGayatri A Katheren Jimmerson, MD  03/16/2018 9:43 AM    [facility]

## 2018-03-23 ENCOUNTER — Other Ambulatory Visit: Payer: Self-pay

## 2018-03-23 ENCOUNTER — Ambulatory Visit (HOSPITAL_COMMUNITY): Payer: Medicare Other | Attending: Cardiology

## 2018-03-23 DIAGNOSIS — I350 Nonrheumatic aortic (valve) stenosis: Secondary | ICD-10-CM

## 2018-03-23 MED ORDER — PERFLUTREN LIPID MICROSPHERE
1.0000 mL | INTRAVENOUS | Status: AC | PRN
  Administered 2018-03-23: 2 mL via INTRAVENOUS

## 2018-04-03 ENCOUNTER — Telehealth: Payer: Self-pay

## 2018-04-03 NOTE — Telephone Encounter (Signed)
-----   Message from Parke PoissonGayatri A Acharya, MD sent at 03/29/2018  9:46 AM EST ----- There is mild aortic stenosis on echo. I would like to see her back in 1 year, sooner if she would like.

## 2018-04-03 NOTE — Telephone Encounter (Signed)
Pt aware of echo results and Dr.Acharya's recommendation for the pt to f/u in 1 year sooner if needed. Pt is agreeable withpri plan. Asked pt to contact the office 2-3 months prior to schedule the 1 year f/u.

## 2019-04-08 ENCOUNTER — Ambulatory Visit: Payer: Medicare Other | Admitting: Internal Medicine

## 2019-04-08 ENCOUNTER — Other Ambulatory Visit: Payer: Self-pay

## 2019-04-08 ENCOUNTER — Encounter: Payer: Self-pay | Admitting: Internal Medicine

## 2019-04-08 VITALS — BP 116/68 | HR 57 | Temp 97.3°F | Ht 63.0 in | Wt 133.0 lb

## 2019-04-08 DIAGNOSIS — I350 Nonrheumatic aortic (valve) stenosis: Secondary | ICD-10-CM | POA: Diagnosis not present

## 2019-04-08 NOTE — Patient Instructions (Addendum)
Medication Instructions:  Your physician recommends that you continue on your current medications as directed. Please refer to the Current Medication list given to you today.  *If you need a refill on your cardiac medications before your next appointment, please call your pharmacy*  Lab Work: NONE   Testing/Procedures: NONE   Follow-Up: At [facility], you and your health needs are our priority.  As part of our continuing mission to provide you with exceptional heart care, we have created designated Provider Care Teams.  These Care Teams include your primary Cardiologist (physician) and Advanced Practice Providers (APPs -  Physician Assistants and Nurse Practitioners) who all work together to provide you with the care you need, when you need it.  Your next appointment:   12 month(s)  You will receive a reminder letter in the mail two months in advance. If you don't receive a letter, please call our office to schedule the follow-up appointment.  The format for your next appointment:   In Person  Provider:   You may see DR Margaretann Loveless  or one of the following Advanced Practice Providers on your designated Care Team:    Rosaria Ferries, PA-C  Jory Sims, DNP, ANP  Cadence Kathlen Mody, NP

## 2019-04-08 NOTE — Progress Notes (Signed)
Cardiology Office Note:    Date:  04/08/2019   ID:  Audrey Hatfield, DOB 09-29-1945, MRN [id number]  PCP:  Aletha Halim., PA-C  Cardiologist:  No primary care provider on file.  Electrophysiologist:  None   Referring MD: Aletha Halim., PA-C   Chief Complaint: Mild aortic stenosis  History of Present Illness:    Audrey Hatfield is a 73 y.o. female with a hx of arthritis, anxiety, hyperlipidemia, thyroid disease.  She presents today at my request for follow-up of mild aortic stenosis.  The patient denies chest pain, chest pressure, dyspnea at rest or with exertion, palpitations, PND, orthopnea, or leg swelling. Denies syncope or presyncope. Denies dizziness or lightheadedness. Denies snoring and has not been evaluated for sleep apnea.  Past Medical History:  Diagnosis Date  . Anxiety   . Arthritis   . Heart murmur   . High cholesterol   . Seasonal allergies   . Thyroid disease   . Wears glasses   . Wears partial dentures     Past Surgical History:  Procedure Laterality Date  . ABDOMINAL HYSTERECTOMY    . CARPAL TUNNEL RELEASE Left 08/08/2014   Procedure: LEFT CARPAL TUNNEL RELEASE;  Surgeon: Roseanne Kaufman, MD;  Location: [facility];  Service: Orthopedics;  Laterality: Left;  . COLONOSCOPY    . SHOULDER ARTHROSCOPY  2010   right  . SHOULDER ARTHROSCOPY  2003   left    Current Medications: Current Meds  Medication Sig  . ALPRAZolam (XANAX) 0.5 MG tablet Take 0.5 mg by mouth at bedtime as needed for anxiety.  Marland Kitchen atorvastatin (LIPITOR) 20 MG tablet Take 20 mg by mouth daily.  . cetirizine (ZYRTEC) 10 MG tablet Take 10 mg by mouth as needed for allergies.  Marland Kitchen levothyroxine (SYNTHROID, LEVOTHROID) 25 MCG tablet Take 25 mcg by mouth daily before breakfast.  . Omega-3 Fatty Acids (FISH OIL) 1000 MG CAPS Take 1 capsule by mouth 2 (two) times daily.  . sertraline (ZOLOFT) 25 MG tablet Take 25 mg by mouth daily.  . [DISCONTINUED] acetaminophen (TYLENOL) 325 MG  tablet Take 650 mg by mouth every 6 (six) hours as needed.  . [DISCONTINUED] docusate sodium (COLACE) 100 MG capsule Take 100 mg by mouth daily.      Allergies:   Patient has no known allergies.   Social History   Socioeconomic History  . Marital status: Divorced    Spouse name: Not on file  . Number of children: 2  . Years of education: Not on file  . Highest education level: Not on file  Occupational History  . Not on file  Social Needs  . Financial resource strain: Not on file  . Food insecurity    Worry: Not on file    Inability: Not on file  . Transportation needs    Medical: Not on file    Non-medical: Not on file  Tobacco Use  . Smoking status: Never Smoker  . Smokeless tobacco: Never Used  Substance and Sexual Activity  . Alcohol use: No  . Drug use: No  . Sexual activity: Not on file  Lifestyle  . Physical activity    Days per week: Not on file    Minutes per session: Not on file  . Stress: Not on file  Relationships  . Social Herbalist on phone: Not on file    Gets together: Not on file    Attends religious service: Not on file    Active member  of club or organization: Not on file    Attends meetings of clubs or organizations: Not on file    Relationship status: Not on file  Other Topics Concern  . Not on file  Social History Narrative  . Not on file     Family History: The patient's family history includes Heart disease in her father; Suicidality in her mother.  ROS:   Please see the history of present illness.    All other systems reviewed and are negative.  EKGs/Labs/Other Studies Reviewed:    The following studies were reviewed today:  EKG: Sinus bradycardia with sinus arrhythmia, left atrial enlargement, left axis deviation, left ventricular hypertrophy   Recent Labs: No results found for requested labs within last 8760 hours.  Recent Lipid Panel No results found for: CHOL, TRIG, HDL, CHOLHDL, VLDL, LDLCALC, LDLDIRECT   Physical Exam:    VS:  BP 116/68 (BP Location: Left Arm, Patient Position: Sitting, Cuff Size: Normal)   Pulse (!) 57   Temp (!) 97.3 F (36.3 C)   Ht 5\' 3"  (1.6 m)   Wt 133 lb (60.3 kg)   BMI 23.56 kg/m     Wt Readings from Last 5 Encounters:  04/08/19 133 lb (60.3 kg)  03/16/18 131 lb 12.8 oz (59.8 kg)  08/08/14 126 lb (57.2 kg)  04/13/14 126 lb (57.2 kg)     Constitutional: No acute distress Eyes: sclera non-icteric, normal conjunctiva and lids ENMT: normal dentition, moist mucous membranes Cardiovascular: regular rhythm, normal rate, 2 out of 6 early-mid peaking systolic ejection murmur, preserved S2. S1 and S2 normal. Radial pulses normal bilaterally. No jugular venous distention.  Respiratory: clear to auscultation bilaterally GI : normal bowel sounds, soft and nontender. No distention.   MSK: extremities warm, well perfused. No edema.  NEURO: grossly nonfocal exam, moves all extremities. PSYCH: alert and oriented x 3, normal mood and affect.   ASSESSMENT:    1. Aortic valve stenosis, etiology of cardiac valve disease unspecified    PLAN:   #1 mild aortic valve stenosis-we can likely check an echocardiogram next year the year after.  By physical exam the valve does not seem to have progressed significantly.  Patient feels well with no complaints  #2 hyperlipidemia-continue atorvastatin 20 mg daily  TIME SPENT WITH PATIENT: 15 minutes of direct patient care. More than 50% of that time was spent on coordination of care and counseling regarding aortic valve disease progression.  Weston BrassGayatri Aleaha Fickling, MD [place]  [facility]   Medication Adjustments/Labs and Tests Ordered: Current medicines are reviewed at length with the patient today.  Concerns regarding medicines are outlined above.  No orders of the defined types were placed in this encounter.  No orders of the defined types were placed in this encounter.   Patient Instructions  Medication Instructions:   Your physician recommends that you continue on your current medications as directed. Please refer to the Current Medication list given to you today.  *If you need a refill on your cardiac medications before your next appointment, please call your pharmacy*  Lab Work: NONE   Testing/Procedures: NONE   Follow-Up: [facility], you and your health needs are our priority.  As part of our continuing mission to provide you with exceptional heart care, we have created designated Provider Care Teams.  These Care Teams include your primary Cardiologist (physician) and Advanced Practice Providers (APPs -  Physician Assistants and Nurse Practitioners) who all work together to provide you with the care  you need, when you need it.  Your next appointment:   12 month(s)  You will receive a reminder letter in the mail two months in advance. If you don't receive a letter, please call our office to schedule the follow-up appointment.  The format for your next appointment:   In Person  Provider:   You may see DR Jacques Navy  or one of the following Advanced Practice Providers on your designated Care Team:    Theodore Demark, PA-C  Joni Reining, DNP, ANP  Cadence Fransico Michael, NP

## 2019-07-20 ENCOUNTER — Ambulatory Visit: Payer: Medicare Other | Attending: Internal Medicine

## 2019-07-20 DIAGNOSIS — Z23 Encounter for immunization: Secondary | ICD-10-CM

## 2019-07-20 NOTE — Progress Notes (Signed)
   Covid-19 Vaccination Clinic  Name:  Audrey Hatfield    MRN: [id number] DOB: 30-Jan-1946  07/20/2019  Ms. [name] was observed post Covid-19 immunization for 15 minutes without incident. She was provided with Vaccine Information Sheet and instruction to access the V-Safe system.   Ms. [name] was instructed to call 911 with any severe reactions post vaccine: Marland Kitchen Difficulty breathing  . Swelling of face and throat  . A fast heartbeat  . A bad rash all over body  . Dizziness and weakness   Immunizations Administered    Name Date Dose VIS Date Route   Moderna COVID-19 Vaccine 07/20/2019 10:59 AM 0.5 mL 04/02/2019 Intramuscular   Manufacturer: Moderna   Lot: [id number]   NDC: 92924-462-86

## 2019-08-21 ENCOUNTER — Ambulatory Visit: Payer: Medicare Other | Attending: Internal Medicine

## 2019-08-21 DIAGNOSIS — Z23 Encounter for immunization: Secondary | ICD-10-CM

## 2019-08-21 NOTE — Progress Notes (Signed)
   Covid-19 Vaccination Clinic  Name:  Layal Javid    MRN: [id number] DOB: 1945/07/18  08/21/2019  Ms. Sulton was observed post Covid-19 immunization for 15 minutes without incident. She was provided with Vaccine Information Sheet and instruction to access the V-Safe system.   Ms. Torelli was instructed to call 911 with any severe reactions post vaccine: Marland Kitchen Difficulty breathing  . Swelling of face and throat  . A fast heartbeat  . A bad rash all over body  . Dizziness and weakness   Immunizations Administered    Name Date Dose VIS Date Route   Moderna COVID-19 Vaccine 08/21/2019 10:23 AM 0.5 mL 04/2019 Intramuscular   Manufacturer: Moderna   Lot: [id number]   NDC: 20233-435-68

## 2019-09-09 ENCOUNTER — Telehealth: Payer: Self-pay

## 2019-09-09 NOTE — Telephone Encounter (Signed)
   [facility] Pre-operative Risk Assessment    Request for surgical clearance:  1. What type of surgery is being performed? Left foot bunionectomy   2. When is this surgery scheduled? 09/24/19   3. What type of clearance is required (medical clearance vs. Pharmacy clearance to hold med vs. Both)? medical  4. Are there any medications that need to be held prior to surgery and how long?none   Practice name and name of physician performing surgery? Dr. Wylene Simmer [facility] 5. What is your office phone number: [phone]    7.   What is your office fax number: [phone]  8.   Anesthesia type (None, local, MAC, general) ? [facility]   Newt Minion 09/09/2019, 3:03 PM  _________________________________________________________________   (provider comments below)

## 2019-09-09 NOTE — Telephone Encounter (Signed)
   Primary Cardiologist: Parke Poisson, MD  Chart reviewed as part of pre-operative protocol coverage. Patient was contacted 09/09/2019 in reference to pre-operative risk assessment for pending surgery as outlined below.  Audrey Hatfield was last seen on 04/08/19 by Dr. Jacques Navy. She has history of arthritis, anxiety, HLD, thyroid disease, and mild aortic stenosis. No symptoms at most recent OV. Cr normal by outside labs. RCRI 0.4% indicating low risk of CV complications. I spoke with patient who affirms she is asymptomatic from cardiac standpoint. She "is out walking all the time" able to exceed 4 METS without angina or dyspnea. Therefore, based on ACC/AHA guidelines, the patient would be at acceptable risk for the planned procedure without further cardiovascular testing. No blood thinners on record to hold.  I will route this recommendation to the requesting party via Epic fax function and remove from pre-op pool.  Please call with questions.  Audrey Montana, PA-C 09/09/2019, 4:46 PM

## 2020-05-05 ENCOUNTER — Encounter: Payer: Self-pay | Admitting: Internal Medicine

## 2020-05-05 ENCOUNTER — Other Ambulatory Visit: Payer: Self-pay

## 2020-05-05 ENCOUNTER — Ambulatory Visit: Payer: Medicare Other | Admitting: Internal Medicine

## 2020-05-05 VITALS — BP 118/76 | HR 71 | Ht 63.0 in | Wt 134.0 lb

## 2020-05-05 DIAGNOSIS — I350 Nonrheumatic aortic (valve) stenosis: Secondary | ICD-10-CM

## 2020-05-05 DIAGNOSIS — E785 Hyperlipidemia, unspecified: Secondary | ICD-10-CM | POA: Diagnosis not present

## 2020-05-05 MED ORDER — ASPIRIN EC 81 MG PO TBEC: 81.0000 mg | DELAYED_RELEASE_TABLET | Freq: Every day | ORAL | 3 refills | Status: AC

## 2020-05-05 NOTE — Patient Instructions (Addendum)
Medication Instructions:  No Changes In Medications at this time.  *If you need a refill on your cardiac medications before your next appointment, please call your pharmacy*  Testing/Procedures: Your physician has requested that you have an echocardiogram in 1 year. Echocardiography is a painless test that uses sound waves to create images of your heart. It provides your doctor with information about the size and shape of your heart and how well your heart's chambers and valves are working. You may receive an ultrasound enhancing agent through an IV if needed to better visualize your heart during the echo.This procedure takes approximately one hour. There are no restrictions for this procedure. This will take place at the [address], [address].   Follow-Up: At [facility], you and your health needs are our priority.  As part of our continuing mission to provide you with exceptional heart care, we have created designated Provider Care Teams.  These Care Teams include your primary Cardiologist (physician) and Advanced Practice Providers (APPs -  Physician Assistants and Nurse Practitioners) who all work together to provide you with the care you need, when you need it.  We recommend signing up for the patient portal called "MyChart".  Sign up information is provided on this After Visit Summary.  MyChart is used to connect with patients for Virtual Visits (Telemedicine).  Patients are able to view lab/test results, encounter notes, upcoming appointments, etc.  Non-urgent messages can be sent to your provider as well.   To learn more about what you can do with MyChart, go to [email or web address].    Your next appointment:   1 year(s)  The format for your next appointment:   In Person  Provider:   Gayatri Acharya, MD  

## 2020-05-05 NOTE — Progress Notes (Signed)
Cardiology Office Note:    Date:  05/05/2020   ID:  Audrey Hatfield, DOB November 28, 1945, MRN [id number]  PCP:  Richmond Campbell., PA-C  Cardiologist:  Parke Poisson, MD  Electrophysiologist:  None   Referring MD: Richmond Campbell., PA-C   Chief Complaint/Reason for Referral: Follow up mild aortic valve stenosis  History of Present Illness:    Audrey Hatfield is a 75 y.o. female with a history of arthritis, anxiety, hyperlipidemia, thyroid disease.  She presents today for follow-up of mild aortic stenosis.  Audrey Hatfield is feeling well today and denies chest pain, shortness of breath, palpitations.  Denies syncope or presyncope.  She remains active on a daily basis doing all of her own activities of daily living without symptoms.  We discussed natural history of aortic valve stenosis which is mild as of last echocardiogram.  By physical exam today the murmur is 3/6 mid peaking with preserved S2, suggestive of some progression of aortic valve disease, but not yet severe.  We will plan for an echocardiogram in 1 year and plan for an office visit at that time as well.  Patient is currently on aspirin 81 mg daily as well as atorvastatin 20 mg daily.  She has an appointment with her primary care physician for annual labs in April.  She denies chest pain, chest pressure, dyspnea at rest or with exertion, palpitations, PND, orthopnea, or leg swelling. Denies cough, fever, chills. Denies nausea, vomiting. Denies syncope or presyncope. Denies dizziness or lightheadedness.    Past Medical History:  Diagnosis Date  . Anxiety   . Arthritis   . Heart murmur   . High cholesterol   . Seasonal allergies   . Thyroid disease   . Wears glasses   . Wears partial dentures     Past Surgical History:  Procedure Laterality Date  . ABDOMINAL HYSTERECTOMY    . CARPAL TUNNEL RELEASE Left 08/08/2014   Procedure: LEFT CARPAL TUNNEL RELEASE;  Surgeon: Dominica Severin, MD;  Location: [facility];  Service:  Orthopedics;  Laterality: Left;  . COLONOSCOPY    . SHOULDER ARTHROSCOPY  2010   right  . SHOULDER ARTHROSCOPY  2003   left    Current Medications: Current Meds  Medication Sig  . ALPRAZolam (XANAX) 0.5 MG tablet Take 0.5 mg by mouth at bedtime as needed for anxiety.  Marland Kitchen aspirin EC 81 MG tablet Take 1 tablet (81 mg total) by mouth daily. Swallow whole.  Marland Kitchen atorvastatin (LIPITOR) 20 MG tablet Take 20 mg by mouth daily.  . cetirizine (ZYRTEC) 10 MG tablet Take 10 mg by mouth as needed for allergies.  Marland Kitchen levothyroxine (SYNTHROID) 50 MCG tablet Take 50 mcg by mouth daily.  . Omega-3 Fatty Acids (FISH OIL) 1000 MG CAPS Take 1 capsule by mouth 2 (two) times daily.  . sertraline (ZOLOFT) 25 MG tablet Take 25 mg by mouth daily.     Allergies:   Patient has no known allergies.   Social History   Tobacco Use  . Smoking status: Never Smoker  . Smokeless tobacco: Never Used  Substance Use Topics  . Alcohol use: No  . Drug use: No     Family History: The patient's family history includes Heart disease in her father; Suicidality in her mother.  ROS:   Please see the history of present illness.    All other systems reviewed and are negative.  EKGs/Labs/Other Studies Reviewed:    The following studies were reviewed today:  EKG:  NSR, low voltage QRS, poor R wave progression,   Recent Labs: No results found for requested labs within last 8760 hours.  Recent Lipid Panel No results found for: CHOL, TRIG, HDL, CHOLHDL, VLDL, LDLCALC, LDLDIRECT  Physical Exam:    VS:  BP 118/76   Pulse 71   Ht 5\' 3"  (1.6 m)   Wt 134 lb (60.8 kg)   SpO2 97%   BMI 23.74 kg/m     Wt Readings from Last 5 Encounters:  05/05/20 134 lb (60.8 kg)  04/08/19 133 lb (60.3 kg)  03/16/18 131 lb 12.8 oz (59.8 kg)  08/08/14 126 lb (57.2 kg)  04/13/14 126 lb (57.2 kg)    Constitutional: No acute distress Eyes: sclera non-icteric, normal conjunctiva and lids ENMT: normal dentition, moist mucous  membranes Cardiovascular: regular rhythm, normal rate, 3/6 mid peaking SEM. S1 and S2 normal. Radial pulses normal bilaterally. No jugular venous distention.  Respiratory: clear to auscultation bilaterally GI : normal bowel sounds, soft and nontender. No distention.   MSK: extremities warm, well perfused. No edema.  NEURO: grossly nonfocal exam, moves all extremities. PSYCH: alert and oriented x 3, normal mood and affect.   ASSESSMENT:    1. Nonrheumatic aortic valve stenosis   2. Hyperlipidemia, unspecified hyperlipidemia type    PLAN:    Nonrheumatic aortic valve stenosis - Plan: EKG 12-Lead, ECHOCARDIOGRAM COMPLETE - She feels well overall and has no symptoms concerning for significant progression of aortic valve disease. Will plan for an echocardiogram in 1 year with follow up visit.  - on ASA 81 mg daily and atorvastatin 20 mg daily.   Hyperlipidemia, unspecified hyperlipidemia type - continue atorvastatin 20 mg daily. Planning to have lab work with her PCP in April.   Total time of encounter: 20 minutes total time of encounter, including 15 minutes spent in face-to-face patient care on the date of this encounter. This time includes coordination of care and counseling regarding above mentioned problem list. Remainder of non-face-to-face time involved reviewing chart documents/testing relevant to the patient encounter and documentation in the medical record. I have independently reviewed documentation from referring provider.   Cherlynn Kaiser, MD [place]  [facility]    Medication Adjustments/Labs and Tests Ordered: Current medicines are reviewed at length with the patient today.  Concerns regarding medicines are outlined above.   Orders Placed This Encounter  Procedures  . EKG 12-Lead  . ECHOCARDIOGRAM COMPLETE   Meds ordered this encounter  Medications  . aspirin EC 81 MG tablet    Sig: Take 1 tablet (81 mg total) by mouth daily. Swallow whole.    Dispense:  90  tablet    Refill:  3    Patient Instructions  Medication Instructions:  No Changes In Medications at this time.  *If you need a refill on your cardiac medications before your next appointment, please call your pharmacy*  Testing/Procedures: Your physician has requested that you have an echocardiogram in 1 year. Echocardiography is a painless test that uses sound waves to create images of your heart. It provides your doctor with information about the size and shape of your heart and how well your heart's chambers and valves are working. You may receive an ultrasound enhancing agent through an IV if needed to better visualize your heart during the echo.This procedure takes approximately one hour. There are no restrictions for this procedure. This will take place at the [address], [address].   Follow-Up: At [facility], you and your  health needs are our priority.  As part of our continuing mission to provide you with exceptional heart care, we have created designated Provider Care Teams.  These Care Teams include your primary Cardiologist (physician) and Advanced Practice Providers (APPs -  Physician Assistants and Nurse Practitioners) who all work together to provide you with the care you need, when you need it.  We recommend signing up for the patient portal called "MyChart".  Sign up information is provided on this After Visit Summary.  MyChart is used to connect with patients for Virtual Visits (Telemedicine).  Patients are able to view lab/test results, encounter notes, upcoming appointments, etc.  Non-urgent messages can be sent to your provider as well.   To learn more about what you can do with MyChart, go to [email or web address].    Your next appointment:   1 year(s)  The format for your next appointment:   In Person  Provider:   Weston Brass, MD

## 2021-01-21 ENCOUNTER — Other Ambulatory Visit: Payer: Self-pay

## 2021-01-21 ENCOUNTER — Encounter: Payer: Self-pay | Admitting: Internal Medicine

## 2021-01-21 ENCOUNTER — Ambulatory Visit: Payer: Medicare Other | Admitting: Internal Medicine

## 2021-01-21 VITALS — BP 117/60 | HR 75 | Ht 62.0 in | Wt 127.0 lb

## 2021-01-21 DIAGNOSIS — E785 Hyperlipidemia, unspecified: Secondary | ICD-10-CM

## 2021-01-21 DIAGNOSIS — I350 Nonrheumatic aortic (valve) stenosis: Secondary | ICD-10-CM | POA: Diagnosis not present

## 2021-01-21 DIAGNOSIS — I517 Cardiomegaly: Secondary | ICD-10-CM | POA: Diagnosis not present

## 2021-01-21 DIAGNOSIS — Z79899 Other long term (current) drug therapy: Secondary | ICD-10-CM | POA: Diagnosis not present

## 2021-01-21 DIAGNOSIS — R0789 Other chest pain: Secondary | ICD-10-CM

## 2021-01-21 MED ORDER — ATORVASTATIN CALCIUM 20 MG PO TABS: 40.0000 mg | ORAL_TABLET | Freq: Every day | ORAL | 3 refills | Status: DC

## 2021-01-21 NOTE — Patient Instructions (Signed)
Medication Instructions:  INCREASE ATORVASTATIN TO 40mg  DAILY- PLEASE CALL WITH ANY ISSUES REFILLING  *If you need a refill on your cardiac medications before your next appointment, please call your pharmacy*  Lab Work: WOULD RECOMMEND LIPID PANEL AND LIVER FUNCTION TESTS IN 3 MONTHS (AROUND December)  If you have labs (blood work) drawn today and your tests are completely normal, you will receive your results only by: MyChart Message (if you have MyChart) OR A paper copy in the mail If you have any lab test that is abnormal or we need to change your treatment, we will call you to review the results.  Testing/Procedures: Your physician has requested that you have an echocardiogram IN JAN OF 2023. Echocardiography is a painless test that uses sound waves to create images of your heart. It provides your doctor with information about the size and shape of your heart and how well your heart's chambers and valves are working. You may receive an ultrasound enhancing agent through an IV if needed to better visualize your heart during the echo.This procedure takes approximately one hour. There are no restrictions for this procedure. This will take place at the [address], [address].   Follow-Up: At [facility], you and your health needs are our priority.  As part of our continuing mission to provide you with exceptional heart care, we have created designated Provider Care Teams.  These Care Teams include your primary Cardiologist (physician) and Advanced Practice Providers (APPs -  Physician Assistants and Nurse Practitioners) who all work together to provide you with the care you need, when you need it.  We recommend signing up for the patient portal called "MyChart".  Sign up information is provided on this After Visit Summary.  MyChart is used to connect with patients for Virtual Visits (Telemedicine).  Patients are able to view lab/test results, encounter notes, upcoming appointments, etc.   Non-urgent messages can be sent to your provider as well.   To learn more about what you can do with MyChart, go to [facility].    Your next appointment:   1 year(s)  The format for your next appointment:   In Person  Provider:   [email or web address], MD

## 2021-01-21 NOTE — Progress Notes (Signed)
Cardiology Office Note:    Date:  01/21/2021   ID:  Doral Digangi, DOB 18-Sep-1945, MRN [id number]  PCP:  Richmond Campbell., PA-C  Cardiologist:  Parke Poisson, MD  Electrophysiologist:  None   Referring MD: Richmond Campbell., PA-C   Chief Complaint/Reason for Referral: Follow up mild aortic valve stenosis  History of Present Illness:    Audrey Hatfield is a 75 y.o. female with a history of arthritis, anxiety, hyperlipidemia, thyroid disease.  She presents today for follow-up of aortic stenosis.  Ms. Wenberg is feeling well today and denies chest pain, shortness of breath, palpitations.  Denies syncope or presyncope. Patient is currently on aspirin 81 mg daily as well as atorvastatin 20 mg daily.    She denies palpitations, PND, orthopnea, or leg swelling. Denies cough, fever, chills. Denies nausea, vomiting. Denies dizziness or lightheadedness.    Past Medical History:  Diagnosis Date   Anxiety    Arthritis    Heart murmur    High cholesterol    Seasonal allergies    Thyroid disease    Wears glasses    Wears partial dentures     Past Surgical History:  Procedure Laterality Date   ABDOMINAL HYSTERECTOMY     CARPAL TUNNEL RELEASE Left 08/08/2014   Procedure: LEFT CARPAL TUNNEL RELEASE;  Surgeon: Dominica Severin, MD;  Location: [facility];  Service: Orthopedics;  Laterality: Left;   COLONOSCOPY     SHOULDER ARTHROSCOPY  2010   right   SHOULDER ARTHROSCOPY  2003   left    Current Medications: Current Meds  Medication Sig   ALPRAZolam (XANAX) 0.5 MG tablet Take 0.5 mg by mouth at bedtime as needed for anxiety.   aspirin EC 81 MG tablet Take 1 tablet (81 mg total) by mouth daily. Swallow whole.   atorvastatin (LIPITOR) 20 MG tablet Take 20 mg by mouth daily.   cetirizine (ZYRTEC) 10 MG tablet Take 10 mg by mouth as needed for allergies.   levothyroxine (SYNTHROID) 50 MCG tablet Take 50 mcg by mouth daily.   Omega-3 Fatty Acids (FISH OIL) 1000 MG CAPS Take  1 capsule by mouth 2 (two) times daily.   sertraline (ZOLOFT) 25 MG tablet Take 25 mg by mouth daily.     Allergies:   Patient has no known allergies.   Social History   Tobacco Use   Smoking status: Never   Smokeless tobacco: Never  Substance Use Topics   Alcohol use: No   Drug use: No     Family History: The patient's family history includes Heart disease in her father; Suicidality in her mother.  ROS:   Please see the history of present illness.    All other systems reviewed and are negative.  EKGs/Labs/Other Studies Reviewed:    The following studies were reviewed today:  EKG:  Today: NSR, LAFB, LVH  Last visit: NSR, low voltage QRS, poor R wave progression  Recent Labs: No results found for requested labs within last 8760 hours.  Recent Lipid Panel No results found for: CHOL, TRIG, HDL, CHOLHDL, VLDL, LDLCALC, LDLDIRECT  Physical Exam:    VS:  BP 117/60   Pulse 75   Ht 5\' 2"  (1.575 m)   Wt 127 lb (57.6 kg)   SpO2 93%   BMI 23.23 kg/m     Wt Readings from Last 5 Encounters:  01/21/21 127 lb (57.6 kg)  05/05/20 134 lb (60.8 kg)  04/08/19 133 lb (60.3 kg)  03/16/18 131 lb 12.8  oz (59.8 kg)  08/08/14 126 lb (57.2 kg)    Constitutional: No acute distress Eyes: sclera non-icteric, normal conjunctiva and lids ENMT: normal dentition, moist mucous membranes Cardiovascular: regular rhythm, normal rate, 3/6 mid peaking SEM. S1 and S2 normal. Radial pulses normal bilaterally. No jugular venous distention.  Respiratory: clear to auscultation bilaterally GI : normal bowel sounds, soft and nontender. No distention.   MSK: extremities warm, well perfused. No edema.  NEURO: grossly nonfocal exam, moves all extremities. PSYCH: alert and oriented x 3, normal mood and affect.   ASSESSMENT:    1. Hyperlipidemia, unspecified hyperlipidemia type   2. LVH (left ventricular hypertrophy)   3. Nonrheumatic aortic valve stenosis     PLAN:    Nonrheumatic aortic valve  stenosis - Plan: EKG 12-Lead - She feels well overall and has no symptoms concerning for significant progression of aortic valve disease. However, AS has gone from mild to moderate by gradient. Will follow with echo in 1 year.  - on ASA 81 mg daily and increasing statin as noted below.  Hyperlipidemia, unspecified hyperlipidemia type Med management. - last LDL 88, discussed further optimization for LDL goal <70. Will increase atorvastatin to 40 mg daily.   Total time of encounter: 30 minutes total time of encounter, including 20 minutes spent in face-to-face patient care on the date of this encounter. This time includes coordination of care and counseling regarding above mentioned problem list. Remainder of non-face-to-face time involved reviewing chart documents/testing relevant to the patient encounter and documentation in the medical record. I have independently reviewed documentation from referring provider.   Weston Brass, MD, [facility] [place]  [facility]    Medication Adjustments/Labs and Tests Ordered: Current medicines are reviewed at length with the patient today.  Concerns regarding medicines are outlined above.   Orders Placed This Encounter  Procedures   EKG 12-Lead    Meds ordered this encounter  Medications   atorvastatin (LIPITOR) 20 MG tablet    Sig: Take 2 tablets (40 mg total) by mouth daily.    Dispense:  90 tablet    Refill:  3     Patient Instructions  Medication Instructions:  INCREASE ATORVASTATIN TO 40mg  DAILY- PLEASE CALL WITH ANY ISSUES REFILLING  *If you need a refill on your cardiac medications before your next appointment, please call your pharmacy*  Lab Work: WOULD RECOMMEND LIPID PANEL AND LIVER FUNCTION TESTS IN 3 MONTHS (AROUND December)  If you have labs (blood work) drawn today and your tests are completely normal, you will receive your results only by: MyChart Message (if you have MyChart) OR A paper copy in the mail If you have  any lab test that is abnormal or we need to change your treatment, we will call you to review the results.  Testing/Procedures: Your physician has requested that you have an echocardiogram IN JAN OF 2023. Echocardiography is a painless test that uses sound waves to create images of your heart. It provides your doctor with information about the size and shape of your heart and how well your heart's chambers and valves are working. You may receive an ultrasound enhancing agent through an IV if needed to better visualize your heart during the echo.This procedure takes approximately one hour. There are no restrictions for this procedure. This will take place at the [address].   Follow-Up: At [facility], you and your health needs are our priority.  As part of our continuing mission to provide you  with exceptional heart care, we have created designated Provider Care Teams.  These Care Teams include your primary Cardiologist (physician) and Advanced Practice Providers (APPs -  Physician Assistants and Nurse Practitioners) who all work together to provide you with the care you need, when you need it.  We recommend signing up for the patient portal called "MyChart".  Sign up information is provided on this After Visit Summary.  MyChart is used to connect with patients for Virtual Visits (Telemedicine).  Patients are able to view lab/test results, encounter notes, upcoming appointments, etc.  Non-urgent messages can be sent to your provider as well.   To learn more about what you can do with MyChart, go to [email or web address].    Your next appointment:   1 year(s)  The format for your next appointment:   In Person  Provider:   Weston Brass, MD

## 2021-02-17 ENCOUNTER — Other Ambulatory Visit: Payer: Self-pay | Admitting: Internal Medicine

## 2021-02-17 ENCOUNTER — Ambulatory Visit (HOSPITAL_COMMUNITY): Payer: Medicare Other | Attending: Cardiology

## 2021-02-17 ENCOUNTER — Other Ambulatory Visit: Payer: Self-pay

## 2021-02-17 DIAGNOSIS — I350 Nonrheumatic aortic (valve) stenosis: Secondary | ICD-10-CM

## 2021-02-17 LAB — ECHOCARDIOGRAM COMPLETE
AR max vel: 0.9 cm2
AV Area VTI: 1.05 cm2
AV Area mean vel: 1.03 cm2
AV Mean grad: 21 mmHg
AV Peak grad: 35.3 mmHg
Ao pk vel: 2.97 m/s
Area-P 1/2: 2.87 cm2
S' Lateral: 2 cm

## 2021-02-17 MED ORDER — PERFLUTREN LIPID MICROSPHERE
1.0000 mL | INTRAVENOUS | Status: AC | PRN
  Administered 2021-02-17: 2 mL via INTRAVENOUS

## 2022-02-07 ENCOUNTER — Other Ambulatory Visit (HOSPITAL_COMMUNITY): Payer: Medicare Other

## 2022-02-15 ENCOUNTER — Ambulatory Visit (HOSPITAL_COMMUNITY): Payer: Medicare Other | Attending: Internal Medicine

## 2022-02-15 ENCOUNTER — Other Ambulatory Visit (HOSPITAL_COMMUNITY): Payer: Self-pay

## 2022-02-15 DIAGNOSIS — I350 Nonrheumatic aortic (valve) stenosis: Secondary | ICD-10-CM | POA: Insufficient documentation

## 2022-02-15 MED ORDER — PERFLUTREN LIPID MICROSPHERE
1.0000 mL | INTRAVENOUS | Status: AC | PRN
  Administered 2022-02-15: 1 mL via INTRAVENOUS

## 2022-02-15 MED ORDER — LIDOCAINE VISCOUS HCL 2 % MT SOLN
5.0000 mL | Freq: Four times a day (QID) | OROMUCOSAL | 0 refills | Status: DC
  Filled 2022-02-15: qty 120, 3d supply, fill #0

## 2022-02-16 ENCOUNTER — Other Ambulatory Visit: Payer: Self-pay | Admitting: *Deleted

## 2022-02-16 ENCOUNTER — Other Ambulatory Visit (HOSPITAL_COMMUNITY): Payer: Self-pay

## 2022-02-16 DIAGNOSIS — I350 Nonrheumatic aortic (valve) stenosis: Secondary | ICD-10-CM

## 2022-02-16 LAB — ECHOCARDIOGRAM COMPLETE
AR max vel: 1.11 cm2
AV Area VTI: 1.09 cm2
AV Area mean vel: 1.01 cm2
AV Mean grad: 19 mmHg
AV Peak grad: 27 mmHg
Ao pk vel: 2.6 m/s
Area-P 1/2: 2.72 cm2
S' Lateral: 1.7 cm

## 2022-02-22 ENCOUNTER — Encounter: Payer: Self-pay | Admitting: Physician Assistant

## 2022-02-22 ENCOUNTER — Ambulatory Visit: Payer: Medicare Other | Attending: Physician Assistant | Admitting: Physician Assistant

## 2022-02-22 VITALS — BP 110/76 | HR 88 | Ht 62.0 in | Wt 128.0 lb

## 2022-02-22 DIAGNOSIS — I350 Nonrheumatic aortic (valve) stenosis: Secondary | ICD-10-CM

## 2022-02-22 NOTE — Progress Notes (Unsigned)
Cardiology Office Note:    Date:  02/22/2022   ID:  Audrey Hatfield, DOB 12-Aug-1945, MRN [id number]  PCP:  Aletha Halim PA-C   [facility] Providers Cardiologist:  Elouise Munroe, MD { Click to update primary MD,subspecialty MD or APP then REFRESH:1}    Referring MD: Aletha Halim., PA-C   Chief Complaint  Patient presents with   Follow-up    1 year.  ***  History of Present Illness:    Audrey Hatfield is a 76 y.o. female with a hx of anxiety, hyperlipidemia, thyroid disease and history of aortic stenosis.  Previous echocardiogram in November 2019 showed EF of 60 to 65%, grade 1 DD, mild aortic stenosis.  Follow-up echocardiogram in October 2022 showed EF 60 to 65%, moderate aortic stenosis.  She was last seen by Dr. Margaretann Loveless in September 2022, she was doing well at the time.  Repeat echocardiogram obtained on 02/15/2022 showed EF 60 to 65%, RV size was normal, normal PA systolic pressure, trivial MR, moderate aortic stenosis.  Aortic valve mean gradient was 19 mmHg when compared to the previous 21 mmHg.   Patient presents today for follow-up.  Overall she has been doing well without any recent chest pain or worsening dyspnea.  She has no lower extremity edema, orthopnea or PND.  On physical exam, she has 2 out of 6 heart murmur.  We have reviewed the recent echocardiogram result.  Her lipid panel is being obtained by her PCP, she is scheduled to see her PCP soon.  I printed out a copy of her echocardiogram for her to keep.  She can have a repeat echocardiogram in 1 year prior to follow-up with Dr. Margaretann Loveless.  She is aware to contact us if she develop any worsening dyspnea on exertion.  Past Medical History:  Diagnosis Date   Anxiety    Arthritis    Heart murmur    High cholesterol    Seasonal allergies    Thyroid disease    Wears glasses    Wears partial dentures     Past Surgical History:  Procedure Laterality Date   ABDOMINAL HYSTERECTOMY     CARPAL TUNNEL  RELEASE Left 08/08/2014   Procedure: LEFT CARPAL TUNNEL RELEASE;  Surgeon: Roseanne Kaufman, MD;  Location: [place];  Service: Orthopedics;  Laterality: Left;   COLONOSCOPY     SHOULDER ARTHROSCOPY  2010   right   SHOULDER ARTHROSCOPY  2003   left    Current Medications: Current Meds  Medication Sig   ALPRAZolam (XANAX) 0.5 MG tablet Take 0.5 mg by mouth at bedtime as needed for anxiety.   aspirin EC 81 MG tablet Take 1 tablet (81 mg total) by mouth daily. Swallow whole.   atorvastatin (LIPITOR) 20 MG tablet Take 2 tablets (40 mg total) by mouth daily.   cetirizine (ZYRTEC) 10 MG tablet Take 10 mg by mouth as needed for allergies.   levothyroxine (SYNTHROID) 50 MCG tablet Take 50 mcg by mouth daily.   Omega-3 Fatty Acids (FISH OIL) 1000 MG CAPS Take 1 capsule by mouth 2 (two) times daily.   sertraline (ZOLOFT) 25 MG tablet Take 25 mg by mouth daily.   [DISCONTINUED] magic mouthwash (lidocaine, diphenhydrAMINE, alum & mag hydroxide) suspension Swish, Gargle, and Spit 2 teaspoonsful (10 mLs) by mouth every 6 (six) hours.     Allergies:   Patient has no known allergies.   Social History   Socioeconomic History   Marital status: Divorced  Spouse name: Not on file   Number of children: 2   Years of education: Not on file   Highest education level: Not on file  Occupational History   Not on file  Tobacco Use   Smoking status: Never   Smokeless tobacco: Never  Substance and Sexual Activity   Alcohol use: No   Drug use: No   Sexual activity: Not on file  Other Topics Concern   Not on file  Social History Narrative   Not on file   Social Determinants of Health   Financial Resource Strain: Not on file  Food Insecurity: Not on file  Transportation Needs: Not on file  Physical Activity: Not on file  Stress: Not on file  Social Connections: Not on file     Family History: The patient's ***family history includes Heart disease in her father; Suicidality in  her mother.  ROS:   Please see the history of present illness.    *** All other systems reviewed and are negative.  EKGs/Labs/Other Studies Reviewed:    The following studies were reviewed today: ***  EKG:  EKG is *** ordered today.  The ekg ordered today demonstrates ***  Recent Labs: No results found for requested labs within last 365 days.  Recent Lipid Panel No results found for: "CHOL", "TRIG", "HDL", "CHOLHDL", "VLDL", "LDLCALC", "LDLDIRECT"   Risk Assessment/Calculations:   {Does this patient have ATRIAL FIBRILLATION?[phone]}       Physical Exam:    VS:  BP 110/76 (BP Location: Right Arm, Patient Position: Sitting, Cuff Size: Normal)   Pulse 88   Ht 5\' 2"  (1.575 m)   Wt 128 lb (58.1 kg)   BMI 23.41 kg/m         Wt Readings from Last 3 Encounters:  02/22/22 128 lb (58.1 kg)  01/21/21 127 lb (57.6 kg)  05/05/20 134 lb (60.8 kg)     GEN: *** Well nourished, well developed in no acute distress HEENT: Normal NECK: No JVD; No carotid bruits LYMPHATICS: No lymphadenopathy CARDIAC: ***RRR, no murmurs, rubs, gallops RESPIRATORY:  Clear to auscultation without rales, wheezing or rhonchi  ABDOMEN: Soft, non-tender, non-distended MUSCULOSKELETAL:  No edema; No deformity  SKIN: Warm and dry NEUROLOGIC:  Alert and oriented x 3 PSYCHIATRIC:  Normal affect   ASSESSMENT:    No diagnosis found. PLAN:    In order of problems listed above:  ***      {Are you ordering a CV Procedure (e.g. stress test, cath, DCCV, TEE, etc)?   Press F2        :07/03/20    Medication Adjustments/Labs and Tests Ordered: Current medicines are reviewed at length with the patient today.  Concerns regarding medicines are outlined above.  No orders of the defined types were placed in this encounter.  No orders of the defined types were placed in this encounter.   There are no Patient Instructions on file for this visit.   [id number]}, Ramond Dial  02/22/2022 3:31 PM    [facility]

## 2022-02-22 NOTE — Patient Instructions (Addendum)
Medication Instructions:  Your physician recommends that you continue on your current medications as directed. Please refer to the Current Medication list given to you today.  *If you need a refill on your cardiac medications before your next appointment, please call your pharmacy*  Lab Work: NONE ordered at this time of appointment   If you have labs (blood work) drawn today and your tests are completely normal, you will receive your results only by: [place] (if you have MyChart) OR A paper copy in the mail If you have any lab test that is abnormal or we need to change your treatment, we will call you to review the results.  Testing/Procedures: NONE ordered at this time of appointment   Follow-Up: At [facility], you and your health needs are our priority.  As part of our continuing mission to provide you with exceptional heart care, we have created designated Provider Care Teams.  These Care Teams include your primary Cardiologist (physician) and Advanced Practice Providers (APPs -  Physician Assistants and Nurse Practitioners) who all work together to provide you with the care you need, when you need it.  We recommend signing up for the patient portal called "MyChart".  Sign up information is provided on this After Visit Summary.  MyChart is used to connect with patients for Virtual Visits (Telemedicine).  Patients are able to view lab/test results, encounter notes, upcoming appointments, etc.  Non-urgent messages can be sent to your provider as well.   To learn more about what you can do with MyChart, go to [email or web address].    Your next appointment:   1 year(s)  The format for your next appointment:   In Person  Provider:   Elouise Munroe, MD     Other Instructions  Important Information About Sugar

## 2022-03-20 LAB — COLOGUARD: COLOGUARD: POSITIVE — AB

## 2023-01-10 ENCOUNTER — Encounter (HOSPITAL_COMMUNITY): Payer: Self-pay | Admitting: Internal Medicine

## 2023-01-31 ENCOUNTER — Ambulatory Visit (HOSPITAL_COMMUNITY): Payer: Medicare Other | Attending: Cardiology

## 2023-01-31 DIAGNOSIS — I350 Nonrheumatic aortic (valve) stenosis: Secondary | ICD-10-CM | POA: Insufficient documentation

## 2023-01-31 LAB — ECHOCARDIOGRAM COMPLETE
AR max vel: 0.78 cm2
AV Area VTI: 0.87 cm2
AV Area mean vel: 0.74 cm2
AV Mean grad: 21 mm[Hg]
AV Peak grad: 31.3 mm[Hg]
Ao pk vel: 2.8 m/s
Area-P 1/2: 2.8 cm2
S' Lateral: 2 cm

## 2023-03-13 ENCOUNTER — Encounter: Payer: Self-pay | Admitting: Physician Assistant

## 2023-03-13 ENCOUNTER — Ambulatory Visit: Payer: Medicare Other | Attending: Physician Assistant | Admitting: Physician Assistant

## 2023-03-13 VITALS — BP 128/70 | HR 53 | Ht 61.0 in | Wt 128.0 lb

## 2023-03-13 DIAGNOSIS — E876 Hypokalemia: Secondary | ICD-10-CM

## 2023-03-13 DIAGNOSIS — I350 Nonrheumatic aortic (valve) stenosis: Secondary | ICD-10-CM | POA: Diagnosis not present

## 2023-03-13 DIAGNOSIS — E785 Hyperlipidemia, unspecified: Secondary | ICD-10-CM

## 2023-03-13 NOTE — Progress Notes (Signed)
Cardiology Office Note:  .   Date:  03/13/2023  ID:  Vonice Hatfield, DOB Aug 22, 1945, MRN [id number] PCP: Richmond Campbell PA-C  [facility] Providers Cardiologist:  Parke Poisson, MD     History of Present Illness: Audrey Hatfield is a 77 y.o. female with a hx of anxiety, hyperlipidemia, thyroid disease and history of aortic stenosis.  Previous echocardiogram in November 2019 showed EF of 60 to 65%, grade 1 DD, mild aortic stenosis.  Follow-up echocardiogram in October 2022 showed EF 60 to 65%, moderate aortic stenosis.  Repeat echocardiogram obtained on 02/15/2022 showed EF 60 to 65%, RV size was normal, normal PA systolic pressure, trivial MR, moderate aortic stenosis.  Aortic valve mean gradient was 19 mmHg when compared to the previous 21 mmHg.  Her lipid panel has been followed by her PCP, last lipid panel obtained on 11//2024 showed total cholesterol 153, triglyceride 136, HDL 49, LDL 81.  Red blood cell count was normal in November.  Renal function is stable, however potassium low at 3.0.  Her PCP has called in some potassium supplement.  Recent echocardiogram obtained on 01/31/2023 showed EF 60 to 65%, grade 1 DD, aortic valve mean gradient was 21 mmHg, this was reviewed by Dr. Doreene Adas who felt it was a stenosis remained moderate.  The recommendation is to proceed with repeat echocardiogram in 1 year.  Talking with the patient today, she denies any chest pain or shortness of breath.  She has no lower extremity edema, orthopnea or PND.  Her blood pressure is very well-controlled.  She can follow-up in 1 year with plan to obtain echocardiogram in October 2025.  ROS:   She denies chest pain, palpitations, dyspnea, pnd, orthopnea, n, v, dizziness, syncope, edema, weight gain, or early satiety. All other systems reviewed and are otherwise negative except as noted above.    Studies Reviewed: .        Cardiac Studies & Procedures       ECHOCARDIOGRAM  ECHOCARDIOGRAM COMPLETE  01/31/2023  Narrative ECHOCARDIOGRAM REPORT    Patient Name:   Audrey Hatfield  Date of Exam: 01/31/2023 Medical Rec #:  [id number]     Height:       62.0 in Accession #:    [id number]    Weight:       128.0 lb Date of Birth:  August 24, 1945     BSA:          1.581 m Patient Age:    77 years      BP:           110/76 mmHg Patient Gender: F             HR:           64 bpm. Exam Location:  [address]  Procedure: 2D Echo, Cardiac Doppler and Color Doppler  Indications:    I35.0 Aortic Stenosis  History:        Patient has prior history of Echocardiogram examinations, most recent 02/15/2022. Signs/Symptoms:Murmur.  Sonographer:    Daphine Deutscher RDCS Referring Phys: [id number] Parke Poisson  IMPRESSIONS   1. Left ventricular ejection fraction, by estimation, is 60 to 65%. The left ventricle has normal function. The left ventricle has no regional wall motion abnormalities. There is mild concentric left ventricular hypertrophy. Left ventricular diastolic parameters are consistent with Grade I diastolic dysfunction (impaired relaxation). 2. Right ventricular systolic function is normal. The right ventricular size is normal. Tricuspid regurgitation signal is inadequate  for assessing PA pressure. 3. The mitral valve is normal in structure. Trivial mitral valve regurgitation. No evidence of mitral stenosis. Moderate mitral annular calcification. 4. The aortic valve is tricuspid. There is severe calcifcation of the aortic valve. Aortic valve regurgitation is not visualized. Severe aortic valve stenosis. Aortic valve area, by VTI measures 0.87 cm. Aortic valve mean gradient measures 21.0 mmHg. Suspect paradoxical low flow/low gradient severe aortic stenosis. 5. The inferior vena cava is normal in size with greater than 50% respiratory variability, suggesting right atrial pressure of 3 mmHg.  FINDINGS Left Ventricle: Left ventricular ejection fraction, by estimation, is 60 to 65%. The  left ventricle has normal function. The left ventricle has no regional wall motion abnormalities. The left ventricular internal cavity size was normal in size. There is mild concentric left ventricular hypertrophy. Left ventricular diastolic parameters are consistent with Grade I diastolic dysfunction (impaired relaxation).  Right Ventricle: The right ventricular size is normal. No increase in right ventricular wall thickness. Right ventricular systolic function is normal. Tricuspid regurgitation signal is inadequate for assessing PA pressure.  Left Atrium: Left atrial size was normal in size.  Right Atrium: Right atrial size was normal in size.  Pericardium: There is no evidence of pericardial effusion.  Mitral Valve: The mitral valve is normal in structure. There is mild calcification of the mitral valve leaflet(s). Moderate mitral annular calcification. Trivial mitral valve regurgitation. No evidence of mitral valve stenosis.  Tricuspid Valve: The tricuspid valve is normal in structure. Tricuspid valve regurgitation is not demonstrated.  Aortic Valve: The aortic valve is tricuspid. There is severe calcifcation of the aortic valve. Aortic valve regurgitation is not visualized. Severe aortic stenosis is present. Aortic valve mean gradient measures 21.0 mmHg. Aortic valve peak gradient measures 31.3 mmHg. Aortic valve area, by VTI measures 0.87 cm.  Pulmonic Valve: The pulmonic valve was normal in structure. Pulmonic valve regurgitation is not visualized.  Aorta: The aortic root is normal in size and structure.  Venous: The inferior vena cava is normal in size with greater than 50% respiratory variability, suggesting right atrial pressure of 3 mmHg.  IAS/Shunts: No atrial level shunt detected by color flow Doppler.   LEFT VENTRICLE PLAX 2D LVIDd:         3.70 cm   Diastology LVIDs:         2.00 cm   LV e' medial:    8.14 cm/s LV PW:         0.90 cm   LV E/e' medial:  10.0 LV IVS:         0.90 cm   LV e' lateral:   9.32 cm/s LVOT diam:     1.80 cm   LV E/e' lateral: 8.7 LV SV:         53 LV SV Index:   34 LVOT Area:     2.54 cm   RIGHT VENTRICLE RV Basal diam:  2.90 cm RV S prime:     11.45 cm/s TAPSE (M-mode): 2.5 cm  LEFT ATRIUM             Index        RIGHT ATRIUM          Index LA diam:        3.40 cm 2.15 cm/m   RA Area:     8.78 cm LA Vol (A2C):   28.3 ml 17.90 ml/m  RA Volume:   16.90 ml 10.69 ml/m LA Vol (A4C):   43.6 ml 27.57  ml/m LA Biplane Vol: 37.8 ml 23.90 ml/m AORTIC VALVE AV Area (Vmax):    0.78 cm AV Area (Vmean):   0.74 cm AV Area (VTI):     0.87 cm AV Vmax:           279.60 cm/s AV Vmean:          204.600 cm/s AV VTI:            0.617 m AV Peak Grad:      31.3 mmHg AV Mean Grad:      21.0 mmHg LVOT Vmax:         86.20 cm/s LVOT Vmean:        59.900 cm/s LVOT VTI:          0.210 m LVOT/AV VTI ratio: 0.34  AORTA Ao Root diam: 3.00 cm Ao Asc diam:  3.10 cm  MITRAL VALVE MV Area (PHT): 2.80 cm     SHUNTS MV Decel Time: 271 msec     Systemic VTI:  0.21 m MV E velocity: 81.50 cm/s   Systemic Diam: 1.80 cm MV A velocity: 104.50 cm/s MV E/A ratio:  0.78  Dalton McleanMD Electronically signed by Wilfred Lacy Signature Date/Time: 01/31/2023/3:48:12 PM    Final             Risk Assessment/Calculations:             Physical Exam:   VS:  BP 128/70 (BP Location: Right Arm, Patient Position: Sitting, Cuff Size: Normal)   Pulse (!) 53   Ht 5\' 1"  (1.549 m)   Wt 128 lb (58.1 kg)   BMI 24.19 kg/m    Wt Readings from Last 3 Encounters:  03/13/23 128 lb (58.1 kg)  02/22/22 128 lb (58.1 kg)  01/21/21 127 lb (57.6 kg)    GEN: Well nourished, well developed in no acute distress NECK: No JVD; No carotid bruits CARDIAC: RRR, no murmurs, rubs, gallops RESPIRATORY:  Clear to auscultation without rales, wheezing or rhonchi  ABDOMEN: Soft, non-tender, non-distended EXTREMITIES:  No edema; No deformity   ASSESSMENT AND  PLAN: .    Aortic Stenosis Moderate severity on recent echocardiogram. Asymptomatic with no shortness of breath, chest discomfort, or dizziness. Discussed potential future need for TAVR procedure if stenosis progresses. -Repeat echocardiogram in October 2025. -Follow-up appointment after echocardiogram.  Hypokalemia Borderline low potassium level at 3.0. Patient prescribed potassium supplement by PCP. -Encouraged consumption of potassium-rich foods.  Hyperlipidemia Well-controlled with recent lipid panel showing total cholesterol 153, triglycerides 136, HDL 49, LDL 81. -Continue current management.        Dispo: Follow-up in 1 year with repeat echocardiogram around October 2025.  Signed, Azalee Course, PA

## 2023-03-13 NOTE — Patient Instructions (Signed)
Medication Instructions:  NO CHANGES *If you need a refill on your cardiac medications before your next appointment, please call your pharmacy*   Lab Work: NO LABS If you have labs (blood work) drawn today and your tests are completely normal, you will receive your results only by: MyChart Message (if you have MyChart) OR A paper copy in the mail If you have any lab test that is abnormal or we need to change your treatment, we will call you to review the results.   Testing/Procedures: Your physician has requested that you have an echocardiogram. Echocardiography is a painless test that uses sound waves to create images of your heart. It provides your doctor with information about the size and shape of your heart and how well your heart's chambers and valves are working. This procedure takes approximately one hour. There are no restrictions for this procedure. Please do NOT wear cologne, perfume, aftershave, or lotions (deodorant is allowed). Please arrive 15 minutes prior to your appointment time.  Please note: We ask at that you not bring children with you during ultrasound (echo/ vascular) testing. Due to room size and safety concerns, children are not allowed in the ultrasound rooms during exams. Our front office staff cannot provide observation of children in our lobby area while testing is being conducted. An adult accompanying a patient to their appointment will only be allowed in the ultrasound room at the discretion of the ultrasound technician under special circumstances. We apologize for any inconvenience.    Follow-Up: At [facility], you and your health needs are our priority.  As part of our continuing mission to provide you with exceptional heart care, we have created designated Provider Care Teams.  These Care Teams include your primary Cardiologist (physician) and Advanced Practice Providers (APPs -  Physician Assistants and Nurse Practitioners) who all work together  to provide you with the care you need, when you need it.  We recommend signing up for the patient portal called "MyChart".  Sign up information is provided on this After Visit Summary.  MyChart is used to connect with patients for Virtual Visits (Telemedicine).  Patients are able to view lab/test results, encounter notes, upcoming appointments, etc.  Non-urgent messages can be sent to your provider as well.   To learn more about what you can do with MyChart, go to [email or web address].    Your next appointment:   1 year(s) after echocardiogram  Provider:   Parke Poisson, MD

## 2023-08-08 ENCOUNTER — Other Ambulatory Visit: Payer: Self-pay

## 2023-08-08 ENCOUNTER — Observation Stay (HOSPITAL_COMMUNITY)
Admission: EM | Admit: 2023-08-08 | Discharge: 2023-08-09 | Disposition: A | Discharge status: Home / Self Care | Attending: Internal Medicine | Admitting: Internal Medicine

## 2023-08-08 ENCOUNTER — Emergency Department (HOSPITAL_COMMUNITY)

## 2023-08-08 ENCOUNTER — Encounter (HOSPITAL_COMMUNITY): Payer: Self-pay

## 2023-08-08 DIAGNOSIS — R55 Syncope and collapse: Secondary | ICD-10-CM | POA: Diagnosis not present

## 2023-08-08 DIAGNOSIS — I5032 Chronic diastolic (congestive) heart failure: Secondary | ICD-10-CM

## 2023-08-08 DIAGNOSIS — Z7982 Long term (current) use of aspirin: Secondary | ICD-10-CM | POA: Insufficient documentation

## 2023-08-08 DIAGNOSIS — R770 Abnormality of albumin: Secondary | ICD-10-CM | POA: Insufficient documentation

## 2023-08-08 DIAGNOSIS — E782 Mixed hyperlipidemia: Secondary | ICD-10-CM | POA: Insufficient documentation

## 2023-08-08 DIAGNOSIS — E039 Hypothyroidism, unspecified: Secondary | ICD-10-CM | POA: Insufficient documentation

## 2023-08-08 DIAGNOSIS — Z79899 Other long term (current) drug therapy: Secondary | ICD-10-CM | POA: Diagnosis not present

## 2023-08-08 DIAGNOSIS — I350 Nonrheumatic aortic (valve) stenosis: Secondary | ICD-10-CM | POA: Diagnosis not present

## 2023-08-08 DIAGNOSIS — F418 Other specified anxiety disorders: Secondary | ICD-10-CM | POA: Insufficient documentation

## 2023-08-08 DIAGNOSIS — R001 Bradycardia, unspecified: Secondary | ICD-10-CM

## 2023-08-08 DIAGNOSIS — E46 Unspecified protein-calorie malnutrition: Secondary | ICD-10-CM | POA: Insufficient documentation

## 2023-08-08 DIAGNOSIS — E8809 Other disorders of plasma-protein metabolism, not elsewhere classified: Secondary | ICD-10-CM | POA: Diagnosis not present

## 2023-08-08 DIAGNOSIS — E876 Hypokalemia: Secondary | ICD-10-CM | POA: Insufficient documentation

## 2023-08-08 LAB — COMPREHENSIVE METABOLIC PANEL WITH GFR
ALT: 16 U/L (ref 0–44)
AST: 26 U/L (ref 15–41)
Albumin: 3.1 g/dL — ABNORMAL LOW (ref 3.5–5.0)
Alkaline Phosphatase: 39 U/L (ref 38–126)
Anion gap: 11 (ref 5–15)
BUN: 16 mg/dL (ref 8–23)
CO2: 26 mmol/L (ref 22–32)
Calcium: 8.4 mg/dL — ABNORMAL LOW (ref 8.9–10.3)
Chloride: 102 mmol/L (ref 98–111)
Creatinine, Ser: 0.64 mg/dL (ref 0.44–1.00)
GFR, Estimated: >60 mL/min (ref >60)
Glucose, Bld: 113 mg/dL — ABNORMAL HIGH (ref 70–99)
Potassium: 2.8 mmol/L — ABNORMAL LOW (ref 3.5–5.1)
Sodium: 139 mmol/L (ref 135–145)
Total Bilirubin: 0.6 mg/dL (ref 0.0–1.2)
Total Protein: 5.7 g/dL — ABNORMAL LOW (ref 6.5–8.1)

## 2023-08-08 LAB — CBC WITH DIFFERENTIAL/PLATELET
Abs Immature Granulocytes: 0.02 10*3/uL (ref 0.00–0.07)
Basophils Absolute: 0 10*3/uL (ref 0.0–0.1)
Basophils Relative: 1 %
Eosinophils Absolute: 0.1 10*3/uL (ref 0.0–0.5)
Eosinophils Relative: 3 %
HCT: 34.8 % — ABNORMAL LOW (ref 36.0–46.0)
Hemoglobin: 11.6 g/dL — ABNORMAL LOW (ref 12.0–15.0)
Immature Granulocytes: 0 %
Lymphocytes Relative: 39 %
Lymphs Abs: 1.8 10*3/uL (ref 0.7–4.0)
MCH: 31.6 pg (ref 26.0–34.0)
MCHC: 33.3 g/dL (ref 30.0–36.0)
MCV: 94.8 fL (ref 80.0–100.0)
Monocytes Absolute: 0.5 10*3/uL (ref 0.1–1.0)
Monocytes Relative: 11 %
Neutro Abs: 2.1 10*3/uL (ref 1.7–7.7)
Neutrophils Relative %: 46 %
Platelets: 212 10*3/uL (ref 150–400)
RBC: 3.67 MIL/uL — ABNORMAL LOW (ref 3.87–5.11)
RDW: 13 % (ref 11.5–15.5)
WBC: 4.6 10*3/uL (ref 4.0–10.5)
nRBC: 0 % (ref 0.0–0.2)

## 2023-08-08 LAB — URINALYSIS, ROUTINE W REFLEX MICROSCOPIC
Bilirubin Urine: NEGATIVE
Glucose, UA: NEGATIVE mg/dL
Ketones, ur: NEGATIVE mg/dL
Leukocytes,Ua: NEGATIVE
Nitrite: NEGATIVE
Protein, ur: NEGATIVE mg/dL
Specific Gravity, Urine: 1.005 (ref 1.005–1.030)
pH: 7 (ref 5.0–8.0)

## 2023-08-08 LAB — TROPONIN I (HIGH SENSITIVITY)
Troponin I (High Sensitivity): 5 ng/L (ref <18)
Troponin I (High Sensitivity): 7 ng/L (ref <18)

## 2023-08-08 LAB — CBG MONITORING, ED: Glucose-Capillary: 98 mg/dL (ref 70–99)

## 2023-08-08 LAB — TSH: TSH: 2.182 u[IU]/mL (ref 0.350–4.500)

## 2023-08-08 LAB — MAGNESIUM: Magnesium: 1.3 mg/dL — ABNORMAL LOW (ref 1.7–2.4)

## 2023-08-08 MED ORDER — ACETAMINOPHEN 650 MG RE SUPP: 650.0000 mg | Freq: Four times a day (QID) | RECTAL | Status: DC | PRN

## 2023-08-08 MED ORDER — MAGNESIUM SULFATE 2 GM/50ML IV SOLN
2.0000 g | Freq: Once | INTRAVENOUS | Status: AC
  Administered 2023-08-08: 2 g via INTRAVENOUS
  Filled 2023-08-08: qty 50

## 2023-08-08 MED ORDER — POTASSIUM CHLORIDE 10 MEQ/100ML IV SOLN
10.0000 meq | INTRAVENOUS | Status: AC
  Administered 2023-08-08 (×3): 10 meq via INTRAVENOUS
  Filled 2023-08-08 (×3): qty 100

## 2023-08-08 MED ORDER — LACTATED RINGERS IV BOLUS
500.0000 mL | Freq: Once | INTRAVENOUS | Status: AC
  Administered 2023-08-08: 500 mL via INTRAVENOUS

## 2023-08-08 MED ORDER — ATORVASTATIN CALCIUM 40 MG PO TABS
40.0000 mg | ORAL_TABLET | Freq: Every day | ORAL | Status: DC
  Administered 2023-08-09: 40 mg via ORAL
  Filled 2023-08-08: qty 1

## 2023-08-08 MED ORDER — ENSURE ENLIVE PO LIQD
237.0000 mL | Freq: Two times a day (BID) | ORAL | Status: DC
  Administered 2023-08-09 (×2): 237 mL via ORAL
  Filled 2023-08-08 (×2): qty 237

## 2023-08-08 MED ORDER — SODIUM CHLORIDE 0.9 % IV SOLN: INTRAVENOUS | Status: AC

## 2023-08-08 MED ORDER — SERTRALINE HCL 50 MG PO TABS
25.0000 mg | ORAL_TABLET | Freq: Every day | ORAL | Status: DC
  Administered 2023-08-08 – 2023-08-09 (×2): 25 mg via ORAL
  Filled 2023-08-08 (×2): qty 1

## 2023-08-08 MED ORDER — ONDANSETRON HCL 4 MG PO TABS: 4.0000 mg | ORAL_TABLET | Freq: Four times a day (QID) | ORAL | Status: DC | PRN

## 2023-08-08 MED ORDER — LEVOTHYROXINE SODIUM 50 MCG PO TABS
50.0000 ug | ORAL_TABLET | Freq: Every day | ORAL | Status: DC
  Administered 2023-08-09: 50 ug via ORAL
  Filled 2023-08-08: qty 1

## 2023-08-08 MED ORDER — POTASSIUM CHLORIDE CRYS ER 20 MEQ PO TBCR
40.0000 meq | EXTENDED_RELEASE_TABLET | Freq: Once | ORAL | Status: AC
  Administered 2023-08-08: 40 meq via ORAL
  Filled 2023-08-08: qty 2

## 2023-08-08 MED ORDER — ONDANSETRON HCL 4 MG/2ML IJ SOLN: 4.0000 mg | Freq: Four times a day (QID) | INTRAMUSCULAR | Status: DC | PRN

## 2023-08-08 MED ORDER — ACETAMINOPHEN 325 MG PO TABS: 650.0000 mg | ORAL_TABLET | Freq: Four times a day (QID) | ORAL | Status: DC | PRN

## 2023-08-08 MED ORDER — ALPRAZOLAM 0.5 MG PO TABS: 0.5000 mg | ORAL_TABLET | Freq: Every evening | ORAL | Status: DC | PRN

## 2023-08-08 MED ORDER — ENOXAPARIN SODIUM 40 MG/0.4ML IJ SOSY
40.0000 mg | PREFILLED_SYRINGE | INTRAMUSCULAR | Status: DC
  Administered 2023-08-08: 40 mg via SUBCUTANEOUS
  Filled 2023-08-08: qty 0.4

## 2023-08-08 NOTE — ED Notes (Signed)
 Pt assisted to the restroom, urine sample not obtained because the pt disposed of it before we were able to collect.

## 2023-08-08 NOTE — ED Provider Notes (Signed)
 [place] EMERGENCY DEPARTMENT AT [facility] Provider Note   CSN: [id number] Arrival date & time: 08/08/23  1554     History {Add pertinent medical, surgical, social history, OB history to HPI:1} No chief complaint on file.   Audrey Hatfield is a 78 y.o. female.  HPI Patient presents for syncopal episode.  Medical history includes arthritis, anxiety, HLD, thyroid disease.  This morning, she was in her normal state of health.  She went to a funeral this afternoon.  Erasmo Score was outside.  As she was walking, she felt a prodrome of dizziness and lightheadedness.  She has to hold onto the arm of a acquaintance.  She had a very brief syncopal episode that was witnessed by bystanders.  There was a report of urine incontinence during this episode.  Patient denies any falls.  Currently, she feels generalized weakness but denies any other symptoms.    Home Medications Prior to Admission medications   Medication Sig Start Date End Date Taking? Authorizing Provider  ALPRAZolam Prudy Feeler) 0.5 MG tablet Take 0.5 mg by mouth at bedtime as needed for anxiety.    [provider]  aspirin EC 81 MG tablet Take 1 tablet (81 mg total) by mouth daily. Swallow whole. 05/05/20   Parke Poisson, MD  atorvastatin (LIPITOR) 20 MG tablet Take 2 tablets (40 mg total) by mouth daily. 01/21/21   Parke Poisson, MD  cetirizine (ZYRTEC) 10 MG tablet Take 10 mg by mouth as needed for allergies.    [provider]  levothyroxine (SYNTHROID) 50 MCG tablet Take 50 mcg by mouth daily. 03/20/20   [provider]  Omega-3 Fatty Acids (FISH OIL) 1000 MG CAPS Take 1 capsule by mouth 2 (two) times daily.    [provider]  sertraline (ZOLOFT) 25 MG tablet Take 25 mg by mouth daily.    [provider]      Allergies    Patient has no known allergies.    Review of Systems   Review of Systems  Neurological:  Positive for syncope and weakness (Generalized).  All other  systems reviewed and are negative.   Physical Exam Updated Vital Signs There were no vitals taken for this visit. Physical Exam Vitals and nursing note reviewed.  Constitutional:      General: She is not in acute distress.    Appearance: Normal appearance. She is well-developed. She is not ill-appearing, toxic-appearing or diaphoretic.  HENT:     Head: Normocephalic and atraumatic.     Right Ear: External ear normal.     Left Ear: External ear normal.     Nose: Nose normal.     Mouth/Throat:     Mouth: Mucous membranes are moist.  Eyes:     Extraocular Movements: Extraocular movements intact.     Conjunctiva/sclera: Conjunctivae normal.  Cardiovascular:     Rate and Rhythm: Normal rate and regular rhythm.     Heart sounds: No murmur heard. Pulmonary:     Effort: Pulmonary effort is normal. No respiratory distress.     Breath sounds: Normal breath sounds. No wheezing or rales.  Chest:     Chest wall: No tenderness.  Abdominal:     General: There is no distension.     Palpations: Abdomen is soft.     Tenderness: There is no abdominal tenderness.  Musculoskeletal:        General: No swelling or deformity. Normal range of motion.     Cervical back: Normal range of  motion and neck supple.  Skin:    General: Skin is warm and dry.     Coloration: Skin is not jaundiced or pale.  Neurological:     General: No focal deficit present.     Mental Status: She is alert and oriented to person, place, and time.     Cranial Nerves: No cranial nerve deficit.     Sensory: No sensory deficit.     Motor: No weakness.     Coordination: Coordination normal.  Psychiatric:        Mood and Affect: Mood normal.        Behavior: Behavior normal.     ED Results / Procedures / Treatments   Labs (all labs ordered are listed, but only abnormal results are displayed) Labs Reviewed - No data to display  EKG None  Radiology No results found.  Procedures Procedures  {Document cardiac  monitor, telemetry assessment procedure when appropriate:1}  Medications Ordered in ED Medications - No data to display  ED Course/ Medical Decision Making/ A&P   {   Click here for ABCD2, HEART and other calculatorsREFRESH Note before signing :1}                              Medical Decision Making  This patient presents to the ED for concern of ***, this involves an extensive number of treatment options, and is a complaint that carries with it a high risk of complications and morbidity.  The differential diagnosis includes ***   Co morbidities that complicate the patient evaluation  ***   Additional history obtained:  Additional history obtained from *** External records from outside source obtained and reviewed including ***   Lab Tests:  I Ordered, and personally interpreted labs.  The pertinent results include:  ***   Imaging Studies ordered:  I ordered imaging studies including ***  I independently visualized and interpreted imaging which showed *** I agree with the radiologist interpretation   Cardiac Monitoring: / EKG:  The patient was maintained on a cardiac monitor.  I personally viewed and interpreted the cardiac monitored which showed an underlying rhythm of: ***   Problem List / ED Course / Critical interventions / Medication management  Patient presenting after a brief syncopal episode with prodrome of dizziness and lightheadedness.  On arrival in the ED, she is well-appearing.  She endorses some mild generalized weakness but denies any other symptoms.  She has no focal neurologic deficits on exam.  Her breathing is unlabored.  Patient was placed on bedside cardiac monitor.  Workup was initiated.***. I ordered medication including ***  for ***  Reevaluation of the patient after these medicines showed that the patient {resolved/improved/worsened:23923::"improved"} I have reviewed the patients home medicines and have made adjustments as  needed   Consultations Obtained:  I requested consultation with the ***,  and discussed lab and imaging findings as well as pertinent plan - they recommend: ***   Social Determinants of Health:  ***   Test / Admission - Considered:  ***   {Document critical care time when appropriate:1} {Document review of labs and clinical decision tools ie heart score, Chads2Vasc2 etc:1}  {Document your independent review of radiology images, and any outside records:1} {Document your discussion with family members, caretakers, and with consultants:1} {Document social determinants of health affecting pt's care:1} {Document your decision making why or why not admission, treatments were needed:1} Final Clinical Impression(s) / ED Diagnoses Final  diagnoses:  None    Rx / DC Orders ED Discharge Orders     None

## 2023-08-08 NOTE — ED Notes (Signed)
Pt ambulatory to bathroom and back to room with standby assist 

## 2023-08-08 NOTE — ED Triage Notes (Signed)
 Pt arrives from funeral via [facility] after pt had a witnessed syncopal episode outside after standing for a period of time. Pt felt dizzy and called over others and sat down prior to passing out. EMS reports loss of control of bladder at time of event. Pt A&O at this time. CBG with EMS 142, HR 24m BP initially was 100/60, came up to 130/67 after 500 NS via 20 g L FA. 98% RA.

## 2023-08-08 NOTE — H&P (Addendum)
 History and Physical    Patient: Audrey Hatfield WUJ:[id number] DOB: 10/13/1945 DOA: 08/08/2023 DOS: the patient was seen and examined on 08/08/2023 PCP: Richmond Campbell., PA-C  Patient coming from: Home  Chief Complaint:  Chief Complaint  Patient presents with   Loss of Consciousness   HPI: Audrey Hatfield is a 78 y.o. female with medical history significant of hyperlipidemia, hypothyroidism, anxiety, depression who presents to the emergency department via EMS from a funeral after having a witnessed syncopal episode.  Patient went to a funeral this afternoon, funeral was outside and while walking, she felt lightheaded and dizzy and had to hold onto people on her sides to avoid falling and she had a brief syncopal episode.  There was no report of postictal state, though it was reported that she had incontinence during this episode.  She complained of generalized weakness at bedside, but denies chest pain, shortness of breath, headache, fever, chills.  ED Course:  In the emergency department, she was hemodynamically stable.  Workup in the ED showed normocytic anemia, BMP was normal except for potassium of 2.8, blood glucose 113, albumin 3.1. Chest x-ray showed low volume exam without acute process Potassium and magnesium were replenished, IV hydration was provided. Hospitalist was asked to admit patient for further evaluation and management.  Review of Systems: Review of systems as noted in the HPI. All other systems reviewed and are negative.   Past Medical History:  Diagnosis Date   Anxiety    Arthritis    Heart murmur    High cholesterol    Seasonal allergies    Thyroid disease    Wears glasses    Wears partial dentures    Past Surgical History:  Procedure Laterality Date   ABDOMINAL HYSTERECTOMY     CARPAL TUNNEL RELEASE Left 08/08/2014   Procedure: LEFT CARPAL TUNNEL RELEASE;  Surgeon: Dominica Severin, MD;  Location: [facility];  Service: Orthopedics;  Laterality:  Left;   COLONOSCOPY     SHOULDER ARTHROSCOPY  2010   right   SHOULDER ARTHROSCOPY  2003   left    Social History:  reports that she has never smoked. She has never used smokeless tobacco. She reports that she does not drink alcohol and does not use drugs.   No Known Allergies  Family History  Problem Relation Age of Onset   Suicidality Mother    Heart disease Father      Prior to Admission medications   Medication Sig Start Date End Date Taking? Authorizing Provider  ALPRAZolam Prudy Feeler) 0.5 MG tablet Take 0.5 mg by mouth at bedtime as needed for anxiety.    [provider]  aspirin EC 81 MG tablet Take 1 tablet (81 mg total) by mouth daily. Swallow whole. 05/05/20   Parke Poisson, MD  atorvastatin (LIPITOR) 40 MG tablet Take 40 mg by mouth daily.    [provider]  cetirizine (ZYRTEC) 10 MG tablet Take 10 mg by mouth as needed for allergies.    [provider]  donepezil (ARICEPT) 10 MG tablet Take 10 mg by mouth at bedtime. 03/06/23   [provider]  levothyroxine (SYNTHROID) 50 MCG tablet Take 50 mcg by mouth daily. 03/20/20   [provider]  Omega-3 Fatty Acids (FISH OIL) 1000 MG CAPS Take 1 capsule by mouth 2 (two) times daily.    [provider]  potassium chloride (KLOR-CON M) 10 MEQ tablet Take 20 mEq by mouth daily.    [provider]  sertraline (ZOLOFT) 25 MG tablet Take 25 mg by mouth daily.    [provider]    Physical Exam: BP (!) 137/114   Pulse 76   Temp (!) 97.5 F (36.4 C) (Oral)   Resp 20   Ht 5\' 4"  (1.626 m)   Wt 58.1 kg   SpO2 99%   BMI 21.97 kg/m   General: 78 y.o. year-old female well developed well nourished in no acute distress.  Alert and oriented x3. HEENT: NCAT, EOMI Neck: Supple, trachea medial Cardiovascular: Systolic murmur loudest in second intercostal space on the right.  Bradycardia.  Regular rate and rhythm with no rubs or gallops.  No thyromegaly or JVD  noted.  No lower extremity edema. 2/4 pulses in all 4 extremities. Respiratory: Clear to auscultation with no wheezes or rales. Good inspiratory effort. Abdomen: Soft, nontender nondistended with normal bowel sounds x4 quadrants. Muskuloskeletal: No cyanosis, clubbing or edema noted bilaterally Neuro: CN II-XII intact, strength 5/5 x 4, sensation, reflexes intact Skin: No ulcerative lesions noted or rashes Psychiatry: Judgement and insight appear normal. Mood is appropriate for condition and setting          Labs on Admission:  Basic Metabolic Panel: Recent Labs  Lab 08/08/23 1624  NA 139  K 2.8*  CL 102  CO2 26  GLUCOSE 113*  BUN 16  CREATININE 0.64  CALCIUM 8.4*  MG 1.3*   Liver Function Tests: Recent Labs  Lab 08/08/23 1624  AST 26  ALT 16  ALKPHOS 39  BILITOT 0.6  PROT 5.7*  ALBUMIN 3.1*   No results for input(s): "LIPASE", "AMYLASE" in the last 168 hours. No results for input(s): "AMMONIA" in the last 168 hours. CBC: Recent Labs  Lab 08/08/23 1624  WBC 4.6  NEUTROABS 2.1  HGB 11.6*  HCT 34.8*  MCV 94.8  PLT 212   Cardiac Enzymes: No results for input(s): "CKTOTAL", "CKMB", "CKMBINDEX", "TROPONINI" in the last 168 hours.  BNP (last 3 results) No results for input(s): "BNP" in the last 8760 hours.  ProBNP (last 3 results) No results for input(s): "PROBNP" in the last 8760 hours.  CBG: Recent Labs  Lab 08/08/23 1622  GLUCAP 98    Radiological Exams on Admission: DG Chest Port 1 View Result Date: 08/08/2023 CLINICAL DATA:  Syncope, dizziness EXAM: PORTABLE CHEST 1 VIEW COMPARISON:  None Available. FINDINGS: Limited portable semi upright exam. Mild cardiac enlargement. Slightly low lung volumes. Negative for edema, definite pneumonia, significant collapse or consolidation. No large effusion or pneumothorax. IMPRESSION: Low volume exam without acute process. Electronically Signed   By: Judie Petit.  Shick M.D.   On: 08/08/2023 18:09    EKG: I independently  viewed the EKG done and my findings are as followed: Normal sinus rhythm at a rate of 58 bpm with LAFB  Assessment/Plan Present on Admission:  Syncope  Principal Problem:   Syncope Active Problems:   Aortic valve stenosis   Hypomagnesemia   Hypokalemia   Hypoalbuminemia due to protein-calorie malnutrition (HCC)   Mixed hyperlipidemia   Acquired hypothyroidism   Depression with anxiety  Syncope Continue telemetry and watch for arrhythmias Troponins x 1 - 5 ; continue to trend troponin EKG personally reviewed showed normal sinus rhythm with rate of 58 bpm with LAFB Echocardiogram done on 06/02/2022 showed LVEF of 60 to 65%.  No RWMA.  Mild concentric LVH.  G1 DD Echocardiogram will be done to rule out significant aortic stenosis or other outflow obstruction, and also to evaluate EF and  to rule out segmental/Regional wall motion abnormalities.  Carotid artery Dopplers will be done to rule out hemodynamically significant stenosis  Hypomagnesemia Mg level is 1.3 This will be replenished Please continue to monitor Mg level and correct accordingly  Hypokalemia K+ is 2.8 K+ will be replenished Please monitor for AM K+ for further replenishmemnt  Hypoalbuminemia Albumin 3.4, protein supplement will be provided  Nonrheumatic aortic valve stenosis Echocardiogram done on 02/20/2023 showed LVEF of 60 to 65%.  Aortic valve is tricuspid.  There is severe calcification of the aortic valve.  Severe AV stenosis. Aortic valve area, by VTI measures 0.87 cm. Aortic valve mean gradient measures 21.0 mmHg Continue aspirin, Lipitor  Mixed hyperlipidemia Continue Lipitor  Acquired hypothyroidism Continue Synthroid  Depression with anxiety Continue with Xanax, Zoloft  DVT prophylaxis: Lovenox  Code Status: Full code  Family Communication: None at bedside  Consults: Cardiology  Severity of Illness: The appropriate patient status for this patient is OBSERVATION. Observation status is  judged to be reasonable and necessary in order to provide the required intensity of service to ensure the patient's safety. The patient's presenting symptoms, physical exam findings, and initial radiographic and laboratory data in the context of their medical condition is felt to place them at decreased risk for further clinical deterioration. Furthermore, it is anticipated that the patient will be medically stable for discharge from the hospital within 2 midnights of admission.   Author: Frankey Shown, DO 08/08/2023 9:11 PM  For on call review [email or web address].

## 2023-08-08 NOTE — ED Notes (Signed)
 Pt given water, crackers and peanut butter per EDP ok and pt request.

## 2023-08-09 ENCOUNTER — Observation Stay (HOSPITAL_COMMUNITY)

## 2023-08-09 ENCOUNTER — Other Ambulatory Visit

## 2023-08-09 ENCOUNTER — Telehealth: Payer: Self-pay | Admitting: *Deleted

## 2023-08-09 ENCOUNTER — Observation Stay (HOSPITAL_BASED_OUTPATIENT_CLINIC_OR_DEPARTMENT_OTHER)

## 2023-08-09 DIAGNOSIS — I5032 Chronic diastolic (congestive) heart failure: Secondary | ICD-10-CM

## 2023-08-09 DIAGNOSIS — R55 Syncope and collapse: Secondary | ICD-10-CM

## 2023-08-09 DIAGNOSIS — E876 Hypokalemia: Secondary | ICD-10-CM | POA: Diagnosis not present

## 2023-08-09 DIAGNOSIS — E782 Mixed hyperlipidemia: Secondary | ICD-10-CM | POA: Diagnosis not present

## 2023-08-09 LAB — ECHOCARDIOGRAM COMPLETE
AR max vel: 1.11 cm2
AV Area VTI: 1.16 cm2
AV Area mean vel: 1.17 cm2
AV Mean grad: 23.5 mmHg
AV Peak grad: 42.3 mmHg
Ao pk vel: 3.25 m/s
Area-P 1/2: 2.4 cm2
Height: 64 in
MV VTI: 2.09 cm2
S' Lateral: 2.2 cm
Weight: 2048 [oz_av]

## 2023-08-09 LAB — COMPREHENSIVE METABOLIC PANEL WITH GFR
ALT: 15 U/L (ref 0–44)
AST: 25 U/L (ref 15–41)
Albumin: 2.7 g/dL — ABNORMAL LOW (ref 3.5–5.0)
Alkaline Phosphatase: 37 U/L — ABNORMAL LOW (ref 38–126)
Anion gap: 7 (ref 5–15)
BUN: 12 mg/dL (ref 8–23)
CO2: 27 mmol/L (ref 22–32)
Calcium: 7.6 mg/dL — ABNORMAL LOW (ref 8.9–10.3)
Chloride: 106 mmol/L (ref 98–111)
Creatinine, Ser: 0.57 mg/dL (ref 0.44–1.00)
GFR, Estimated: >60 mL/min (ref >60)
Glucose, Bld: 96 mg/dL (ref 70–99)
Potassium: 2.8 mmol/L — ABNORMAL LOW (ref 3.5–5.1)
Sodium: 140 mmol/L (ref 135–145)
Total Bilirubin: 0.4 mg/dL (ref 0.0–1.2)
Total Protein: 5 g/dL — ABNORMAL LOW (ref 6.5–8.1)

## 2023-08-09 LAB — MAGNESIUM: Magnesium: 1.7 mg/dL (ref 1.7–2.4)

## 2023-08-09 LAB — CBC
HCT: 33.1 % — ABNORMAL LOW (ref 36.0–46.0)
Hemoglobin: 11.1 g/dL — ABNORMAL LOW (ref 12.0–15.0)
MCH: 31.7 pg (ref 26.0–34.0)
MCHC: 33.5 g/dL (ref 30.0–36.0)
MCV: 94.6 fL (ref 80.0–100.0)
Platelets: 200 10*3/uL (ref 150–400)
RBC: 3.5 MIL/uL — ABNORMAL LOW (ref 3.87–5.11)
RDW: 13.2 % (ref 11.5–15.5)
WBC: 4.5 10*3/uL (ref 4.0–10.5)
nRBC: 0 % (ref 0.0–0.2)

## 2023-08-09 LAB — PHOSPHORUS: Phosphorus: 2.5 mg/dL (ref 2.5–4.6)

## 2023-08-09 MED ORDER — MIDODRINE HCL 5 MG PO TABS
2.5000 mg | ORAL_TABLET | Freq: Three times a day (TID) | ORAL | Status: DC
  Administered 2023-08-09: 2.5 mg via ORAL
  Filled 2023-08-09: qty 1

## 2023-08-09 MED ORDER — ASPIRIN 81 MG PO TBEC
81.0000 mg | DELAYED_RELEASE_TABLET | Freq: Every day | ORAL | Status: DC
  Administered 2023-08-09: 81 mg via ORAL
  Filled 2023-08-09: qty 1

## 2023-08-09 MED ORDER — POTASSIUM CHLORIDE CRYS ER 20 MEQ PO TBCR
40.0000 meq | EXTENDED_RELEASE_TABLET | Freq: Once | ORAL | Status: AC
  Administered 2023-08-09: 40 meq via ORAL
  Filled 2023-08-09: qty 2

## 2023-08-09 MED ORDER — LEVOTHYROXINE SODIUM 50 MCG PO TABS
50.0000 ug | ORAL_TABLET | Freq: Every day | ORAL | 3 refills | Status: AC
Start: 2023-08-09 — End: ?

## 2023-08-09 MED ORDER — POTASSIUM CHLORIDE CRYS ER 10 MEQ PO TBCR: 20.0000 meq | EXTENDED_RELEASE_TABLET | Freq: Every day | ORAL | 1 refills | Status: AC

## 2023-08-09 MED ORDER — MIDODRINE HCL 2.5 MG PO TABS: 2.5000 mg | ORAL_TABLET | Freq: Three times a day (TID) | ORAL | 1 refills | Status: AC

## 2023-08-09 NOTE — Telephone Encounter (Signed)
-----   Message from [facility] sent at 08/09/2023  3:22 PM EDT ----- Please arrange 15 days event monitor for Ms. Audrey Hatfield to rule out arrhythmia after she presented to the hospital with syncope.  thanks

## 2023-08-09 NOTE — Discharge Summary (Signed)
 Physician Discharge Summary   Patient: Audrey Hatfield MRN: [id number] DOB: 1946/04/23  Admit date:     08/08/2023  Discharge date: 08/09/23  Discharge Physician: Vassie Loll   PCP: Richmond Campbell., PA-C   Recommendations at discharge:  Repeat basic metabolic panel and magnesium level to follow electrolytes trend/stability Reassess blood pressure and further adjust midodrine dosage as required   Discharge Diagnoses: Principal Problem:   Syncope Active Problems:   Aortic valve stenosis   Hypomagnesemia   Hypokalemia   Hypoalbuminemia due to protein-calorie malnutrition (HCC)   Mixed hyperlipidemia   Acquired hypothyroidism   Depression with anxiety   Chronic diastolic HF (heart failure) [name]  Brief Hospital admission narrative: As per H&P written by Dr. Thomes Dinning on 08/08/2023 Audrey Hatfield is a 78 y.o. female with medical history significant of hyperlipidemia, hypothyroidism, anxiety, depression who presents to the emergency department via EMS from a funeral after having a witnessed syncopal episode.  Patient went to a funeral this afternoon, funeral was outside and while walking, she felt lightheaded and dizzy and had to hold onto people on her sides to avoid falling and she had a brief syncopal episode.  There was no report of postictal state, though it was reported that she had incontinence during this episode.  She complained of generalized weakness at bedside, but denies chest pain, shortness of breath, headache, fever, chills.   ED Course:  In the emergency department, she was hemodynamically stable.  Workup in the ED showed normocytic anemia, BMP was normal except for potassium of 2.8, blood glucose 113, albumin 3.1. Chest x-ray showed low volume exam without acute process Potassium and magnesium were replenished, IV hydration was provided. Hospitalist was asked to admit patient for further evaluation and management.  Assessment and Plan: 1-syncope -Appears to be associated  with dehydration and orthostatic changes -After fluid resuscitation and initiation of midodrine patient expressed feeling asymptomatic and ready to go back home -EKG without appreciated arrhythmias and negative troponin -2D echo with preserved ejection fraction and grade 1 diastolic dysfunction; unchanged valvular disorder appreciated.  There was no wall motion abnormalities. -Patient advised to maintain adequate hydration and to arrange follow-up with PCP in 10 days -Nonobstructive carotid artery Dopplers appreciated; will continue baby aspirin and statin. -Outpatient event monitoring will be arranged.  2-hypokalemia/hypomagnesemia -Electrolytes repleted -Daily maintenance supplementation has been started -Given concern for associated adrenal dysfunction patient has been started on midodrine 3 times a day -Repeat basic metabolic panel follow-up visit to assess electrolytes trend.  3-nonrheumatic aortic valve disease -Continue patient follow-up with cardiology service.  4-mixed hyperlipidemia -Continue statin.  5-hypothyroidism -Continue Synthroid.  6-depression/anxiety -Continue treatment with Xanax and Zoloft  7-grade 1 diastolic dysfunction -Chronic, stable and compensated -Heart healthy/low-sodium diet discussed with patient -Patient advised to maintain adequate hydration -Continue patient follow-up with PCP and cardiology service.  Consultants: None Procedures performed: See below for x-ray reports. Disposition: Home Diet recommendation: Heart healthy/low-sodium diet.  DISCHARGE MEDICATION: Allergies as of 08/09/2023   No Known Allergies      Medication List     TAKE these medications    ALPRAZolam 0.5 MG tablet Commonly known as: XANAX Take 0.5 mg by mouth at bedtime as needed for anxiety or sleep.   aspirin EC 81 MG tablet Take 1 tablet (81 mg total) by mouth daily. Swallow whole.   atorvastatin 40 MG tablet Commonly known as: LIPITOR Take 40 mg by mouth  daily.   cetirizine 10 MG tablet Commonly known as: ZYRTEC Take 10 mg  by mouth as needed for allergies.   levothyroxine 50 MCG tablet Commonly known as: SYNTHROID Take 1 tablet (50 mcg total) by mouth daily.   midodrine 2.5 MG tablet Commonly known as: PROAMATINE Take 1 tablet (2.5 mg total) by mouth 3 (three) times daily with meals.   potassium chloride 10 MEQ tablet Commonly known as: KLOR-CON M Take 2 tablets (20 mEq total) by mouth daily.   sertraline 25 MG tablet Commonly known as: ZOLOFT Take 25 mg by mouth daily.        Follow-up Information     Richmond Campbell., PA-C. Schedule an appointment as soon as possible for a visit in 10 day(s).   Specialty: Family Medicine Contact information: [address] [place] [address] [phone]                Discharge Exam: [name]   08/08/23 1609  Weight: 58.1 kg   General exam: Alert, awake, oriented x 3; in no acute distress and feeling ready to go home. Respiratory system: Clear to auscultation. Respiratory effort normal.  Good saturation on room air. Cardiovascular system:RRR. No rubs or gallops; positive systolic murmur appreciated on exam. Gastrointestinal system: Abdomen is nondistended, soft and nontender. No organomegaly or masses felt. Normal bowel sounds heard. Central nervous system: Alert and oriented. No focal neurological deficits. Extremities: No cyanosis or clubbing. Skin: No petechiae. Psychiatry: Judgement and insight appear normal. Mood & affect appropriate.    Condition at discharge: Stable and improved.  The results of significant diagnostics from this hospitalization (including imaging, microbiology, ancillary and laboratory) are listed below for reference.   Imaging Studies: ECHOCARDIOGRAM COMPLETE Result Date: 08/09/2023    ECHOCARDIOGRAM REPORT   Patient Name:   Audrey Hatfield Date of Exam: 08/09/2023 Medical Rec #:  [id number]    Height:       64.0 in Accession #:     [id number]   Weight:       128.0 lb Date of Birth:  Feb 06, 1946    BSA:          1.618 m Patient Age:    78 years     BP:           141/66 mmHg Patient Gender: F            HR:           67 bpm. [facility] Procedure: 2D Echo, Cardiac Doppler and Color Doppler (Both Spectral and Color            Flow Doppler were utilized during procedure). Indications:    Syncope  History:        Patient has prior history of Echocardiogram examinations. AS,                 Signs/Symptoms:Syncope and Chest Pain; Risk Factors:HLD.  Sonographer:    Lamont Snowball Referring Phys: [id number] OLADAPO ADEFESO  Sonographer Comments: Technically difficult study due to poor echo windows and suboptimal apical window. Image acquisition challenging due to respiratory motion and Image acquisition challenging due to patient body habitus. IMPRESSIONS  1. Left ventricular ejection fraction, by estimation, is 65 to 70%. The left ventricle has normal function. The left ventricle has no regional wall motion abnormalities. Left ventricular diastolic parameters are consistent with Grade I diastolic dysfunction (impaired relaxation).  2. Right ventricular systolic function is normal. The right ventricular size is normal. Tricuspid regurgitation signal is inadequate for assessing PA pressure.  3. The mitral valve is  degenerative. Trivial mitral valve regurgitation.  4. The aortic valve is tricuspid. There is severe calcifcation of the aortic valve. Aortic valve regurgitation is not visualized. Moderate to severe aortic valve stenosis, paradoxical normal flow/low gradient. Aortic valve area, by VTI measures 1.16 cm. Aortic valve mean gradient measures 23.5 mmHg. Dimentionless index 0.41.  5. The inferior vena cava is normal in size with greater than 50% respiratory variability, suggesting right atrial pressure of 3 mmHg. Comparison(s): Prior images reviewed side by side. LVEF vigorous at 65-70%. Moderate to severe aortic stenosis as outlined.  FINDINGS  Left Ventricle: Left ventricular ejection fraction, by estimation, is 65 to 70%. The left ventricle has normal function. The left ventricle has no regional wall motion abnormalities. The left ventricular internal cavity size was normal in size. There is  no left ventricular hypertrophy. Left ventricular diastolic parameters are consistent with Grade I diastolic dysfunction (impaired relaxation). Right Ventricle: The right ventricular size is normal. No increase in right ventricular wall thickness. Right ventricular systolic function is normal. Tricuspid regurgitation signal is inadequate for assessing PA pressure. Left Atrium: Left atrial size was normal in size. Right Atrium: Right atrial size was normal in size. Pericardium: There is no evidence of pericardial effusion. Presence of epicardial fat layer. Mitral Valve: The mitral valve is degenerative in appearance. Mild mitral annular calcification. Trivial mitral valve regurgitation. MV peak gradient, 6.2 mmHg. The mean mitral valve gradient is 3.0 mmHg. Tricuspid Valve: The tricuspid valve is grossly normal. Tricuspid valve regurgitation is trivial. Aortic Valve: The aortic valve is tricuspid. There is severe calcifcation of the aortic valve. There is mild aortic valve annular calcification. Aortic valve regurgitation is not visualized. Moderate to severe aortic stenosis is present. Aortic valve mean gradient measures 23.5 mmHg. Aortic valve peak gradient measures 42.2 mmHg. Aortic valve area, by VTI measures 1.16 cm. Pulmonic Valve: The pulmonic valve was grossly normal. Pulmonic valve regurgitation is trivial. Aorta: The aortic root and ascending aorta are structurally normal, with no evidence of dilitation. Venous: The inferior vena cava is normal in size with greater than 50% respiratory variability, suggesting right atrial pressure of 3 mmHg. IAS/Shunts: No atrial level shunt detected by color flow Doppler. Additional Comments: 3D was performed  not requiring image post processing on an independent workstation and was indeterminate.  LEFT VENTRICLE PLAX 2D LVIDd:         3.90 cm   Diastology LVIDs:         2.20 cm   LV e' medial:    9.36 cm/s LV PW:         0.80 cm   LV E/e' medial:  12.0 LV IVS:        0.90 cm   LV e' lateral:   8.16 cm/s LVOT diam:     1.90 cm   LV E/e' lateral: 13.7 LV SV:         85 LV SV Index:   53 LVOT Area:     2.84 cm  IVC IVC diam: 1.40 cm LEFT ATRIUM             Index        RIGHT ATRIUM           Index LA Vol (A2C):   31.3 ml 19.34 ml/m  RA Area:     14.00 cm LA Vol (A4C):   18.5 ml 11.43 ml/m  RA Volume:   31.10 ml  19.22 ml/m LA Biplane Vol: 26.2 ml 16.19 ml/m  AORTIC VALVE  AV Area (Vmax):    1.11 cm AV Area (Vmean):   1.17 cm AV Area (VTI):     1.16 cm AV Vmax:           325.00 cm/s AV Vmean:          229.000 cm/s AV VTI:            0.737 m AV Peak Grad:      42.2 mmHg AV Mean Grad:      23.5 mmHg LVOT Vmax:         127.00 cm/s LVOT Vmean:        94.200 cm/s LVOT VTI:          0.301 m LVOT/AV VTI ratio: 0.41  AORTA Ao Root diam: 3.20 cm Ao Asc diam:  3.00 cm MITRAL VALVE MV Area (PHT): 2.40 cm     SHUNTS MV Area VTI:   2.09 cm     Systemic VTI:  0.30 m MV Peak grad:  6.2 mmHg     Systemic Diam: 1.90 cm MV Mean grad:  3.0 mmHg MV Vmax:       1.25 m/s MV Vmean:      73.6 cm/s MV Decel Time: 316 msec MV E velocity: 112.00 cm/s MV A velocity: 129.00 cm/s MV E/A ratio:  0.87 Nona Dell MD Electronically signed by Nona Dell MD Signature Date/Time: 08/09/2023/2:52:48 PM    Final    DG Chest Port 1 View Result Date: 08/08/2023 CLINICAL DATA:  Syncope, dizziness EXAM: PORTABLE CHEST 1 VIEW COMPARISON:  None Available. FINDINGS: Limited portable semi upright exam. Mild cardiac enlargement. Slightly low lung volumes. Negative for edema, definite pneumonia, significant collapse or consolidation. No large effusion or pneumothorax. IMPRESSION: Low volume exam without acute process. Electronically Signed   By: Judie Petit.   Shick M.D.   On: 08/08/2023 18:09    Microbiology: No results found for this or any previous visit.  Labs: CBC: Recent Labs  Lab 08/08/23 1624 08/09/23 0536  WBC 4.6 4.5  NEUTROABS 2.1  --   HGB 11.6* 11.1*  HCT 34.8* 33.1*  MCV 94.8 94.6  PLT 212 200   Basic Metabolic Panel: Recent Labs  Lab 08/08/23 1624 08/09/23 0536  NA 139 140  K 2.8* 2.8*  CL 102 106  CO2 26 27  GLUCOSE 113* 96  BUN 16 12  CREATININE 0.64 0.57  CALCIUM 8.4* 7.6*  MG 1.3* 1.7  PHOS  --  2.5   Liver Function Tests: Recent Labs  Lab 08/08/23 1624 08/09/23 0536  AST 26 25  ALT 16 15  ALKPHOS 39 37*  BILITOT 0.6 0.4  PROT 5.7* 5.0*  ALBUMIN 3.1* 2.7*   CBG: Recent Labs  Lab 08/08/23 1622  GLUCAP 98    Discharge time spent: greater than 30 minutes.  Signed: Vassie Loll, MD [facility] 08/09/2023

## 2023-08-09 NOTE — Plan of Care (Signed)

## 2023-08-09 NOTE — Care Management Obs Status (Signed)
 MEDICARE OBSERVATION STATUS NOTIFICATION   Patient Details  Name: Audrey Hatfield MRN: [id number] Date of Birth: [birth year]   Medicare Observation Status Notification Given:  Yes    Barron Alvine, RN 08/09/2023, 4:43 PM

## 2023-08-09 NOTE — Progress Notes (Signed)
 Transition of Care Department [facility]) has reviewed patient and no other TOC needs have been identified at this time. We will continue to monitor patient advancement through interdisciplinary progression rounds. If new patient transition needs arise, please place a TOC consult.   08/09/23 0753  TOC Brief Assessment  Insurance and Status Reviewed  Patient has primary care physician Yes  Home environment has been reviewed Lives alone.  Prior level of function: Independent.  Prior/Current Home Services No current home services  Social Drivers of Health Review SDOH reviewed no interventions necessary  Readmission risk has been reviewed Yes  Transition of care needs no transition of care needs at this time

## 2023-09-12 ENCOUNTER — Encounter: Payer: Self-pay | Admitting: Internal Medicine

## 2023-09-12 ENCOUNTER — Ambulatory Visit: Attending: Internal Medicine | Admitting: Internal Medicine

## 2023-09-12 VITALS — BP 120/82 | HR 72 | Ht 63.0 in | Wt 125.4 lb

## 2023-09-12 DIAGNOSIS — R55 Syncope and collapse: Secondary | ICD-10-CM

## 2023-09-12 DIAGNOSIS — Z01818 Encounter for other preprocedural examination: Secondary | ICD-10-CM

## 2023-09-12 DIAGNOSIS — I350 Nonrheumatic aortic (valve) stenosis: Secondary | ICD-10-CM | POA: Diagnosis not present

## 2023-09-12 NOTE — Progress Notes (Signed)
  Cardiology Office Note:  .   Date:  09/12/2023  ID:  Audrey Hatfield, DOB Oct 12, 1945, MRN [id number] PCP: Lory Rough PA-C  [facility] Providers Cardiologist:  Euell Herrlich, MD    History of Present Illness: .   Audrey Hatfield is a 78 y.o. female.  Discussed the use of AI scribe software for clinical note transcription with the patient, who gave verbal consent to proceed.  History of Present Illness Audrey Hatfield "Audrey Hatfield" is an 78 year old female with aortic valve stenosis who presents with a syncopal episode.  She experienced lightheadedness and dizziness before losing consciousness at a funeral, with no postictal state but with incontinence. This is her first syncopal episode, though she has recently experienced frequent lightheadedness. Her aortic valve stenosis, initially mild in 2019, is monitored with echocardiograms. She is mildly anemic with a hemoglobin level of 11. Her potassium was low at 2.8 during the ER visit but is now 4.3 after supplementation. She fell at her mailbox, resulting in a bruise and epistaxis, without recalling dizziness or loss of consciousness, though her sister noted dizziness. She takes aspirin  81 mg daily, raising concern about falls. She maintains hydration but has a low food intake.    ROS: negative except per HPI above.  Studies Reviewed: .        Results LABS Hb: 11 g/dL (84/13/2440) K: 2.8 mmol/L (09/12/2023)  DIAGNOSTIC Echocardiogram: Moderate to possibly moderate severe aortic valve stenosis Heart monitor: Normal Risk Assessment/Calculations:       Physical Exam:   VS:  BP 120/82   Pulse 72   Ht 5\' 3"  (1.6 m)   Wt 125 lb 6.4 oz (56.9 kg)   SpO2 97%   BMI 22.21 kg/m    Wt Readings from Last 3 Encounters:  09/12/23 125 lb 6.4 oz (56.9 kg)  08/08/23 128 lb (58.1 kg)  03/13/23 128 lb (58.1 kg)     Physical Exam GENERAL: Alert, cooperative, well developed, no acute distress. HEENT: Normocephalic, normal oropharynx,  moist mucous membranes. CHEST: Clear to auscultation bilaterally, no wheezes, rhonchi, or crackles. CARDIOVASCULAR: 3/6 late peaking systolic murmur at right upper sternal border. ABDOMEN: Soft, non-tender, non-distended, without organomegaly, normal bowel sounds. EXTREMITIES: No cyanosis or edema. NEUROLOGICAL: Cranial nerves grossly intact, moves all extremities without gross motor or sensory deficit.   ASSESSMENT AND PLAN: .    Assessment and Plan Assessment & Plan Aortic valve stenosis, possibly severe Severe aortic valve stenosis suspected due to syncope and lightheadedness. Echocardiogram suggests paradoxical low flow, low gradient stenosis. Explained TAVR as a less invasive option with risks and benefits. - by exam, S2 obscured and murmur late peaking, could be severe AS.  - Order TAVR CT scan of the heart to evaluate severity. - Refer to structural heart team for further evaluation and management.  Hypokalemia Hypokalemia with recent low potassium level of 2.8, improved to 4.3 after supplementation. Discussed importance of maintaining potassium levels. - Continue potassium supplementation as prescribed. - Monitor potassium levels regularly.  Anemia, unspecified Mild anemia with hemoglobin level of 11. No specific cause identified. - Monitor hemoglobin levels and assess for symptoms.      Grady Lawman, MD, [facility]

## 2023-09-12 NOTE — Patient Instructions (Signed)
 Medication Instructions:  No Changes  Lab Work: None  Testing/Procedures:   Your cardiac CT will be scheduled at one of the below locations:   [facility] [address] [place], [place] [address] [phone]  OR  [facility] [address] [place], [place] [address] [phone]   Jeralene Mom. [facility] [address]  [place], [place] [address] Opening August 28, 2023  If scheduled at [facility], please arrive at the [facility] and Children's Entrance (Entrance C2) of [facility] 30 minutes prior to test start time. You can use the FREE valet parking offered at entrance C (encouraged to control the heart rate for the test)  Proceed to the [facility] Radiology Department (first floor) to check-in and test prep.   All radiology patients and guests should use entrance C2 at [facility], accessed from [facility], even though the hospital's physical address listed is [address].    If scheduled at the Heart and Vascular Tower at [address], please enter the parking lot using the [address] entrance and use the FREE valet service at the patient drop-off area. Enter the buidling and check-in with registration on the main floor.   Please follow these instructions carefully (unless otherwise directed):   On the Night Before the Test: Be sure to Drink plenty of water. Do not consume any caffeinated/decaffeinated beverages or chocolate 12 hours prior to your test. Do not take any antihistamines 12 hours prior to your test.  On the Day of the Test: Drink plenty of water until 1 hour prior to the test. Do not eat any food 1 hour prior to test. You may take your regular medications prior to the test.  If you take Furosemide/Hydrochlorothiazide/Spironolactone/Chlorthalidone, please HOLD on the morning of the test. Patients who wear a continuous  glucose monitor MUST remove the device prior to scanning. FEMALES- please wear underwire-free bra if available, avoid dresses & tight clothing       After the Test: Drink plenty of water. After receiving IV contrast, you may experience a mild flushed feeling. This is normal. On occasion, you may experience a mild rash up to 24 hours after the test. This is not dangerous. If this occurs, you can take Benadryl 25 mg, Zyrtec, Claritin, or Allegra and increase your fluid intake. (Patients taking Tikosyn should avoid Benadryl, and may take Zyrtec, Claritin, or Allegra) If you experience trouble breathing, this can be serious. If it is severe call [phone] IMMEDIATELY. If it is mild, please call our office.  We will call to schedule your test 2-4 weeks out understanding that some insurance companies will need an authorization prior to the service being performed.   For more information and frequently asked questions, please visit our website : [email or web address]  For non-scheduling related questions, please contact the cardiac imaging nurse navigator should you have any questions/concerns: Cardiac Imaging Nurse Navigators Direct Office Dial: [phone]   For scheduling needs, including cancellations and rescheduling, please call [place], [phone].   Follow-Up: At [facility], you and your health needs are our priority.  As part of our continuing mission to provide you with exceptional heart care, our providers are all part of one team.  This team includes your primary Cardiologist (physician) and Advanced Practice Providers or APPs (Physician Assistants and Nurse Practitioners) who all work together to provide you with the care you need, when you need it.  Your  next appointment:   6 month(s)  Provider:   Euell Herrlich, MD   We recommend signing up for the patient portal called "MyChart".  Sign up information is provided on this After Visit Summary.  MyChart is  used to connect with patients for Virtual Visits (Telemedicine).  Patients are able to view lab/test results, encounter notes, upcoming appointments, etc.  Non-urgent messages can be sent to your provider as well.   To learn more about what you can do with MyChart, go to [email or web address].

## 2023-09-15 ENCOUNTER — Other Ambulatory Visit: Payer: Self-pay

## 2023-09-15 DIAGNOSIS — I350 Nonrheumatic aortic (valve) stenosis: Secondary | ICD-10-CM

## 2023-09-15 DIAGNOSIS — R55 Syncope and collapse: Secondary | ICD-10-CM

## 2023-09-22 LAB — BASIC METABOLIC PANEL WITH GFR
BUN/Creatinine Ratio: 16 (ref 12–28)
BUN: 13 mg/dL (ref 8–27)
CO2: 22 mmol/L (ref 20–29)
Calcium: 9.9 mg/dL (ref 8.7–10.3)
Chloride: 101 mmol/L (ref 96–106)
Creatinine, Ser: 0.8 mg/dL (ref 0.57–1.00)
Glucose: 97 mg/dL (ref 70–99)
Potassium: 4.3 mmol/L (ref 3.5–5.2)
Sodium: 141 mmol/L (ref 134–144)
eGFR: 74 mL/min/{1.73_m2} (ref >59)

## 2023-09-26 ENCOUNTER — Ambulatory Visit: Payer: Self-pay | Admitting: Internal Medicine

## 2023-10-02 ENCOUNTER — Ambulatory Visit: Payer: Self-pay | Admitting: Internal Medicine

## 2023-10-02 ENCOUNTER — Ambulatory Visit (HOSPITAL_COMMUNITY)
Admission: RE | Admit: 2023-10-02 | Discharge: 2023-10-02 | Disposition: A | Discharge status: Home / Self Care | Source: Ambulatory Visit | Attending: Cardiovascular Disease | Admitting: Cardiovascular Disease

## 2023-10-02 DIAGNOSIS — Z01818 Encounter for other preprocedural examination: Secondary | ICD-10-CM | POA: Diagnosis not present

## 2023-10-02 DIAGNOSIS — Z01810 Encounter for preprocedural cardiovascular examination: Secondary | ICD-10-CM

## 2023-10-02 DIAGNOSIS — I350 Nonrheumatic aortic (valve) stenosis: Secondary | ICD-10-CM | POA: Diagnosis present

## 2023-10-02 MED ORDER — IOHEXOL 350 MG/ML SOLN
100.0000 mL | Freq: Once | INTRAVENOUS | Status: AC | PRN
Start: 2023-10-02 — End: 2023-10-02
  Administered 2023-10-02: 100 mL via INTRAVENOUS

## 2023-10-03 ENCOUNTER — Encounter: Payer: Self-pay | Admitting: Physician Assistant
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
## 2023-10-06 ENCOUNTER — Encounter: Payer: Self-pay | Admitting: Internal Medicine

## 2023-10-06 ENCOUNTER — Ambulatory Visit: Attending: Internal Medicine | Admitting: Internal Medicine

## 2023-10-06 VITALS — BP 114/84 | HR 82 | Ht 61.5 in | Wt 123.0 lb

## 2023-10-06 DIAGNOSIS — E785 Hyperlipidemia, unspecified: Secondary | ICD-10-CM | POA: Diagnosis not present

## 2023-10-06 DIAGNOSIS — I350 Nonrheumatic aortic (valve) stenosis: Secondary | ICD-10-CM | POA: Diagnosis not present

## 2023-10-06 DIAGNOSIS — N182 Chronic kidney disease, stage 2 (mild): Secondary | ICD-10-CM

## 2023-10-06 NOTE — Patient Instructions (Signed)
 Medication Instructions:  No changes *If you need a refill on your cardiac medications before your next appointment, please call your pharmacy*  Lab Work: none If you have labs (blood work) drawn today and your tests are completely normal, you will receive your results only by: MyChart Message (if you have MyChart) OR A paper copy in the mail If you have any lab test that is abnormal or we need to change your treatment, we will call you to review the results.  Testing/Procedures: ECHO DUE IN DECEMBER 2025 Your physician has requested that you have an echocardiogram. Echocardiography is a painless test that uses sound waves to create images of your heart. It provides your doctor with information about the size and shape of your heart and how well your heart's chambers and valves are working. This procedure takes approximately one hour. There are no restrictions for this procedure. Please do NOT wear cologne, perfume, aftershave, or lotions (deodorant is allowed). Please arrive 15 minutes prior to your appointment time.  Please note: We ask at that you not bring children with you during ultrasound (echo/ vascular) testing. Due to room size and safety concerns, children are not allowed in the ultrasound rooms during exams. Our front office staff cannot provide observation of children in our lobby area while testing is being conducted. An adult accompanying a patient to their appointment will only be allowed in the ultrasound room at the discretion of the ultrasound technician under special circumstances. We apologize for any inconvenience.   Follow-Up: At [facility], you and your health needs are our priority.  As part of our continuing mission to provide you with exceptional heart care, our providers are all part of one team.  This team includes your primary Cardiologist (physician) and Advanced Practice Providers or APPs (Physician Assistants and Nurse Practitioners) who all work  together to provide you with the care you need, when you need it.  Your next appointment:   6 month(s) - GET ECHOCARDIOGRAM FIRST  Provider:   Alyssa Backbone, MD    We recommend signing up for the patient portal called "MyChart".  Sign up information is provided on this After Visit Summary.  MyChart is used to connect with patients for Virtual Visits (Telemedicine).  Patients are able to view lab/test results, encounter notes, upcoming appointments, etc.  Non-urgent messages can be sent to your provider as well.   To learn more about what you can do with MyChart, go to [email or web address].

## 2024-03-08 ENCOUNTER — Ambulatory Visit (HOSPITAL_COMMUNITY): Payer: Medicare Other

## 2024-04-05 ENCOUNTER — Ambulatory Visit (HOSPITAL_COMMUNITY)

## 2024-05-17 ENCOUNTER — Ambulatory Visit (HOSPITAL_COMMUNITY)
Admission: RE | Admit: 2024-05-17 | Discharge: 2024-05-17 | Disposition: A | Discharge status: Home / Self Care | Source: Ambulatory Visit | Attending: Internal Medicine | Admitting: Internal Medicine

## 2024-05-17 DIAGNOSIS — I350 Nonrheumatic aortic (valve) stenosis: Secondary | ICD-10-CM | POA: Insufficient documentation

## 2024-05-17 LAB — ECHOCARDIOGRAM COMPLETE
AR max vel: 0.89 cm2
AV Area VTI: 0.71 cm2
AV Area mean vel: 0.88 cm2
AV Mean grad: 16.4 mmHg
AV Peak grad: 35.5 mmHg
Ao pk vel: 2.98 m/s
Area-P 1/2: 3.74 cm2
MV VTI: 1.97 cm2
S' Lateral: 2.1 cm

## 2024-05-17 MED ORDER — PERFLUTREN LIPID MICROSPHERE
1.0000 mL | INTRAVENOUS | Status: AC | PRN
  Administered 2024-05-17: 2 mL via INTRAVENOUS

## 2024-05-18 ENCOUNTER — Ambulatory Visit: Payer: Self-pay | Admitting: Internal Medicine

## 2024-06-07 ENCOUNTER — Ambulatory Visit: Admitting: Internal Medicine

## 2024-06-07 ENCOUNTER — Encounter: Payer: Self-pay | Admitting: Internal Medicine

## 2024-06-07 VITALS — BP 126/86 | HR 92 | Ht 61.0 in | Wt 128.1 lb

## 2024-06-07 DIAGNOSIS — E785 Hyperlipidemia, unspecified: Secondary | ICD-10-CM

## 2024-06-07 DIAGNOSIS — F0390 Unspecified dementia without behavioral disturbance: Secondary | ICD-10-CM

## 2024-06-07 DIAGNOSIS — I350 Nonrheumatic aortic (valve) stenosis: Secondary | ICD-10-CM

## 2024-06-07 NOTE — Patient Instructions (Addendum)
 Medication Instructions:  No medication changes were made at this visit. Continue current regimen.   *If you need a refill on your cardiac medications before your next appointment, please call your pharmacy*  Lab Work: None ordered today. If you have labs (blood work) drawn today and your tests are completely normal, you will receive your results only by: MyChart Message (if you have MyChart) OR A paper copy in the mail If you have any lab test that is abnormal or we need to change your treatment, we will call you to review the results.  Testing/Procedures: You have been referred to neurology. Their office will be in touch with scheduling this appointment.  Your physician has requested that you have an echocardiogram. Echocardiography is a painless test that uses sound waves to create images of your heart. It provides your doctor with information about the size and shape of your heart and how well your hearts chambers and valves are working. This procedure takes approximately one hour. There are no restrictions for this procedure. Please do NOT wear cologne, perfume, aftershave, or lotions (deodorant is allowed). Please arrive 15 minutes prior to your appointment time.  Please note: We ask at that you not bring children with you during ultrasound (echo/ vascular) testing. Due to room size and safety concerns, children are not allowed in the ultrasound rooms during exams. Our front office staff cannot provide observation of children in our lobby area while testing is being conducted. An adult accompanying a patient to their appointment will only be allowed in the ultrasound room at the discretion of the ultrasound technician under special circumstances. We apologize for any inconvenience.   Follow-Up: At [facility], you and your health needs are our priority.  As part of our continuing mission to provide you with exceptional heart care, our providers are all part of one team.  This  team includes your primary Cardiologist (physician) and Advanced Practice Providers or APPs (Physician Assistants and Nurse Practitioners) who all work together to provide you with the care you need, when you need it.  Your next appointment:   6 month(s)  Provider:   Arun Thukkani, MD

## 2024-12-06 ENCOUNTER — Ambulatory Visit (HOSPITAL_COMMUNITY)
